# Patient Record
Sex: Female | Born: 1990 | Hispanic: No | Marital: Single | Smoking: Former smoker
Health system: Southern US, Community
[De-identification: ages and names within clinical notes are randomized; demographics above are authoritative.]

## PROBLEM LIST (undated history)

## (undated) ENCOUNTER — Emergency Department (HOSPITAL_COMMUNITY): Discharge status: Absent without leave | Payer: BLUE CROSS/BLUE SHIELD

## (undated) DIAGNOSIS — M791 Myalgia, unspecified site: Secondary | ICD-10-CM

## (undated) DIAGNOSIS — F419 Anxiety disorder, unspecified: Secondary | ICD-10-CM

## (undated) HISTORY — DX: Myalgia, unspecified site: M79.10

## (undated) HISTORY — DX: Anxiety disorder, unspecified: F41.9

---
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
## 2008-01-20 ENCOUNTER — Ambulatory Visit: Payer: Self-pay | Admitting: Family Medicine

## 2008-01-20 DIAGNOSIS — J45909 Unspecified asthma, uncomplicated: Secondary | ICD-10-CM | POA: Insufficient documentation

## 2008-02-17 ENCOUNTER — Ambulatory Visit: Payer: Self-pay | Admitting: Family Medicine

## 2008-03-19 ENCOUNTER — Ambulatory Visit: Payer: Self-pay | Admitting: Family Medicine

## 2013-06-09 ENCOUNTER — Ambulatory Visit (INDEPENDENT_AMBULATORY_CARE_PROVIDER_SITE_OTHER): Payer: BC Managed Care – PPO | Admitting: Family Medicine

## 2013-06-09 ENCOUNTER — Ambulatory Visit (INDEPENDENT_AMBULATORY_CARE_PROVIDER_SITE_OTHER): Payer: BC Managed Care – PPO

## 2013-06-09 ENCOUNTER — Encounter: Payer: Self-pay | Admitting: Family Medicine

## 2013-06-09 VITALS — BP 143/84 | HR 95 | Wt 212.0 lb

## 2013-06-09 DIAGNOSIS — M545 Low back pain, unspecified: Secondary | ICD-10-CM

## 2013-06-09 MED ORDER — PREDNISONE 20 MG PO TABS: ORAL_TABLET | ORAL | Status: AC

## 2013-06-09 NOTE — Progress Notes (Signed)
CC: Jessica Cannon is a 23 y.o. female is here for Back Pain   Subjective: HPI:  Very pleasant 23 year old here to establish care  Patient reports a history of chronic back pain that has been present for matter of 8 years. It comes and goes on a monthly basis. Since Friday she has had any acute worsening of the character and severity of her back pain. Pain is not described as a tightness, soreness, localized in the middle of the back in the lower lumbar region that occasionally radiates into the legs however she is uncertain if one leg is more affected than the other. Symptoms are worse with sitting for long periods of time, back extension. Symptoms are improved with bending forward, anti-inflammatories.  She denies any recent or remote trauma or overexertion. She describes it only as pain. Denies overlying skin changes, subtle paresthesia, bowel or bladder incontinence.    Review of Systems - General ROS: negative for - chills, fever, night sweats, weight gain or weight loss Ophthalmic ROS: negative for - decreased vision Psychological ROS: negative for - anxiety or depression ENT ROS: negative for - hearing change, nasal congestion, tinnitus or allergies Hematological and Lymphatic ROS: negative for - bleeding problems, bruising or swollen lymph nodes Breast ROS: negative Respiratory ROS: no cough, shortness of breath, or wheezing Cardiovascular ROS: no chest pain or dyspnea on exertion Gastrointestinal ROS: no abdominal pain, change in bowel habits, or black or bloody stools Genito-Urinary ROS: negative for - genital discharge, genital ulcers, incontinence or abnormal bleeding from genitals Musculoskeletal ROS: negative for - joint pain or muscle pain other than that described above Neurological ROS: negative for - headaches or memory loss Dermatological ROS: negative for lumps, mole changes, rash and skin lesion changes  Past Medical History  Diagnosis Date  . Anxiety   . Muscle ache      Past Surgical History  Procedure Laterality Date  . Cesarean section     Family History  Problem Relation Age of Onset  . Alcohol abuse Mother   . Hypertension Mother   . Diabetes Brother     History   Social History  . Marital Status: Single    Spouse Name: N/A    Number of Children: N/A  . Years of Education: N/A   Occupational History  . Not on file.   Social History Main Topics  . Smoking status: Current Every Day Smoker -- 0.25 packs/day  . Smokeless tobacco: Never Used  . Alcohol Use: No  . Drug Use: No  . Sexual Activity: Yes    Birth Control/ Protection: Pill     Comment: pt is unsure of the name    Other Topics Concern  . Not on file   Social History Narrative  . No narrative on file     Objective: BP 143/84  Pulse 95  Wt 212 lb (96.163 kg)  General: Alert and Oriented, No Acute Distress HEENT: Pupils equal, round, reactive to light. Conjunctivae clear.   Lungs: Clear to auscultation bilaterally, no wheezing/ronchi/rales.  Comfortable work of breathing. Good air movement. Cardiac: Regular rate and rhythm. Normal S1/S2.  No murmurs, rubs, nor gallops.   Back: Pain is reproduced with palpation of L4 and L3 spinous process with mild reproduction of pain with palpation of paraspinal musculature. Stork test is positive bilaterally. Straight leg raises and Faber negative bilaterally. She has full range of motion strength in both lower extremities with normal L4 and S1 DTRs two over four bilaterally.full range of  motion in flexion and extension of the lumbar spine  Extremities: No peripheral edema.  Strong peripheral pulses.  Mental Status: No depression, anxiety, nor agitation. Skin: Warm and dry.  Assessment & Plan: Jessica Cannon was seen today for back pain.  Diagnoses and associated orders for this visit:  Low back pain - predniSONE (DELTASONE) 20 MG tablet; Three tabs daily days 1-3, two tabs daily days 4-6, one tab daily days 7-9, half tab daily days  10-13. - DG Lumbar Spine Complete; Future    Low back pain: Suspicion of facet disease causing etiology, she's never had an x-ray of her back and we will obtain one today given chronicity of on-and-off pain. Start prednisone taper.  Return if symptoms worsen or fail to improve.

## 2013-06-13 ENCOUNTER — Telehealth: Payer: Self-pay | Admitting: *Deleted

## 2013-06-13 NOTE — Telephone Encounter (Signed)
Pt called and left a vague message that she had questions about her xrays. I called and left a message that she could call me back and leave a detailed brief message about with her questions so that I can call back with an answer or if she needed to speak with a triage to nurse to dial that number and listen to the prompt for that number

## 2013-09-26 ENCOUNTER — Encounter: Payer: Self-pay | Admitting: Family Medicine

## 2013-09-26 DIAGNOSIS — N912 Amenorrhea, unspecified: Secondary | ICD-10-CM | POA: Insufficient documentation

## 2013-10-04 ENCOUNTER — Ambulatory Visit (INDEPENDENT_AMBULATORY_CARE_PROVIDER_SITE_OTHER): Payer: BC Managed Care – PPO | Admitting: Family Medicine

## 2013-10-04 ENCOUNTER — Encounter: Payer: Self-pay | Admitting: Family Medicine

## 2013-10-04 VITALS — BP 122/89 | HR 83 | Wt 213.0 lb

## 2013-10-04 DIAGNOSIS — G5600 Carpal tunnel syndrome, unspecified upper limb: Secondary | ICD-10-CM

## 2013-10-04 DIAGNOSIS — G5602 Carpal tunnel syndrome, left upper limb: Secondary | ICD-10-CM

## 2013-10-04 DIAGNOSIS — Z23 Encounter for immunization: Secondary | ICD-10-CM

## 2013-10-04 NOTE — Progress Notes (Signed)
CC: Jessica Cannon is a 23 y.o. female is here for left hand/wrist pain   Subjective: HPI:  Complete left hand discomfort described as numbness and tingling localized in the palm of the hand mostly involving the index, middle, thumb. It has been present for the past year but most problematic over the past 2 months. Slightly improved with wearing a wrist immobilizer. Symptoms are worse when typing and after falling asleep. Other than above nothing seems to make better or worse. She reports weakness only when symptoms are present. Denies any overlying skin changes, swelling, redness, warmth. Denies any recent or remote trauma denies any joint pain.   Review Of Systems Outlined In HPI  Past Medical History  Diagnosis Date  . Anxiety   . Muscle ache     Past Surgical History  Procedure Laterality Date  . Cesarean section     Family History  Problem Relation Age of Onset  . Alcohol abuse Mother   . Hypertension Mother   . Diabetes Brother     History   Social History  . Marital Status: Single    Spouse Name: N/A    Number of Children: N/A  . Years of Education: N/A   Occupational History  . Not on file.   Social History Main Topics  . Smoking status: Current Every Day Smoker -- 0.25 packs/day  . Smokeless tobacco: Never Used  . Alcohol Use: No  . Drug Use: No  . Sexual Activity: Yes    Birth Control/ Protection: Pill     Comment: pt is unsure of the name    Other Topics Concern  . Not on file   Social History Narrative  . No narrative on file     Objective: BP 122/89  Pulse 83  Wt 213 lb (96.616 kg)  General: Alert and Oriented, No Acute Distress HEENT: Pupils equal, round, reactive to light. Conjunctivae clear.  Moist mucous membranes pharynx unremarkable Extremities: No peripheral edema.  Strong peripheral pulses. Full range of motion and strength in the left wrist and distally throughout the hand. Finkelstein's negative, no pain in anatomical snuff box. Pain  reproduced with reverse Phalen's. No sign of atrophy in the hands were overlying skin changes in the region of her discomfort Mental Status: No depression, anxiety, nor agitation. Skin: Warm and dry.  Assessment & Plan: Jessica Cannon was seen today for left hand/wrist pain.  Diagnoses and associated orders for this visit:  Left carpal tunnel syndrome    Continue to wear the brace while sleeping and during any waking hours other than when taking a shower or using the toilet. She was given a handout on home rehabilitative exercises to be done on a daily basis the next 4 weeks and if no better at that point referral to Dr. Karie Schwalbe. in sports medicine for consideration of steroid injection  Return if symptoms worsen or fail to improve.

## 2013-11-16 ENCOUNTER — Ambulatory Visit (INDEPENDENT_AMBULATORY_CARE_PROVIDER_SITE_OTHER): Payer: BC Managed Care – PPO | Admitting: Sports Medicine

## 2013-11-16 ENCOUNTER — Encounter: Payer: Self-pay | Admitting: Sports Medicine

## 2013-11-16 VITALS — BP 137/91 | HR 92 | Ht 66.0 in | Wt 215.0 lb

## 2013-11-16 DIAGNOSIS — N3090 Cystitis, unspecified without hematuria: Secondary | ICD-10-CM | POA: Insufficient documentation

## 2013-11-16 DIAGNOSIS — R300 Dysuria: Secondary | ICD-10-CM

## 2013-11-16 LAB — POCT URINALYSIS DIPSTICK
Bilirubin, UA: NEGATIVE
Blood, UA: NEGATIVE
Glucose, UA: NEGATIVE
Ketones, UA: NEGATIVE
Nitrite, UA: NEGATIVE
Protein, UA: NEGATIVE
Spec Grav, UA: 1.02
Urobilinogen, UA: 0.2
pH, UA: 6

## 2013-11-16 MED ORDER — CEPHALEXIN 500 MG PO CAPS: 500.0000 mg | ORAL_CAPSULE | Freq: Two times a day (BID) | ORAL | Status: DC

## 2013-11-16 NOTE — Progress Notes (Signed)
  Subjective:    CC: dysuria  HPI: This is a pleasant 23 year old female, she comes in with a several-day history of burning, urgency, pain with urination. No flank pain, no abdominal pain, no constitutional symptoms. she did have a bit of nausea.  Past medical history, Surgical history, Family history not pertinant except as noted below, Social history, Allergies, and medications have been entered into the medical record, reviewed, and no changes needed.   Review of Systems: No fevers, chills, night sweats, weight loss, chest pain, or shortness of breath.   Objective:    General: Well Developed, well nourished, and in no acute distress.  Neuro: Alert and oriented x3, extra-ocular muscles intact, sensation grossly intact.  HEENT: Normocephalic, atraumatic, pupils equal round reactive to light, neck supple, no masses, no lymphadenopathy, thyroid nonpalpable.  Skin: Warm and dry, no rashes. Cardiac: Regular rate and rhythm, no murmurs rubs or gallops, no lower extremity edema.  Respiratory: Clear to auscultation bilaterally. Not using accessory muscles, speaking in full sentences. Abdomen: Soft, only minimally tender to palpation in the epigastrium, nondistended, normal bowel sounds, no costovertebral angle pain, no guarding, no rigidity.  Urinalysis shows pyuria.  Impression and Recommendations:

## 2013-11-16 NOTE — Assessment & Plan Note (Signed)
Urine culture, Keflex for 7 days. She did have some epigastric pain, she will return if this hasn't resolved in a week.

## 2013-11-19 LAB — URINE CULTURE
Colony Count: NO GROWTH
Organism ID, Bacteria: NO GROWTH

## 2014-01-08 ENCOUNTER — Telehealth: Payer: Self-pay

## 2014-01-08 MED ORDER — PREDNISONE 20 MG PO TABS: ORAL_TABLET | ORAL | Status: AC

## 2014-01-08 NOTE — Telephone Encounter (Signed)
I'd still recommend PT since it will prevent future pain however I've also sent a prednisone taper to [facility] here in [place].

## 2014-01-08 NOTE — Telephone Encounter (Signed)
Jessica Cannon called and reports a flare up of her low back pain for a few days. She states Dr Ivan Anchors told her to call in for a refill on the Prednisone taper pack when needed. I did mention he Dr Hommel's recommendation of PT on the imaging result. She was agreeable to the PT but would like to call them first to find out what her co-pay would be. Provided patient with the phone number to our PT. Also mentioned that she may need to schedule a follow up visit with Dr Ivan Anchors.

## 2014-01-09 NOTE — Telephone Encounter (Signed)
Message left on vm 

## 2014-04-27 ENCOUNTER — Ambulatory Visit: Payer: Self-pay | Admitting: Physician Assistant

## 2014-04-30 ENCOUNTER — Ambulatory Visit (INDEPENDENT_AMBULATORY_CARE_PROVIDER_SITE_OTHER): Payer: BLUE CROSS/BLUE SHIELD | Admitting: Physician Assistant

## 2014-04-30 ENCOUNTER — Encounter: Payer: Self-pay | Admitting: Physician Assistant

## 2014-04-30 VITALS — BP 151/93 | HR 96 | Ht 66.0 in | Wt 227.0 lb

## 2014-04-30 DIAGNOSIS — F410 Panic disorder [episodic paroxysmal anxiety] without agoraphobia: Secondary | ICD-10-CM | POA: Diagnosis not present

## 2014-04-30 DIAGNOSIS — F430 Acute stress reaction: Secondary | ICD-10-CM

## 2014-04-30 DIAGNOSIS — F411 Generalized anxiety disorder: Secondary | ICD-10-CM

## 2014-04-30 MED ORDER — ALPRAZOLAM 0.5 MG PO TABS: 0.5000 mg | ORAL_TABLET | Freq: Two times a day (BID) | ORAL | Status: DC | PRN

## 2014-04-30 MED ORDER — SERTRALINE HCL 50 MG PO TABS: ORAL_TABLET | ORAL | Status: DC

## 2014-04-30 NOTE — Progress Notes (Signed)
   Subjective:    Patient ID: Jessica Cannon, female    DOB: 08/06/1990, [age] y.o.   MRN: [id number]  HPI  Pt has had a hx of anxiety for about 9 years since her daughter was born. Never too bad as long as on xanax prn and zoloft. She lost insurance and not been on in years. For the last year been getting worse and worse. She has problems decision making, she constantly feels anxious. For last couple of months anxiety builds to a panic attack where her heart flutters and face gets red. Tuesday a co-worker yelled at her and she lost it. Cried, chest tightness, SOB, shaking. No suicidal or homicidal htoughts.    Review of Systems  All other systems reviewed and are negative.      Objective:   Physical Exam  Constitutional: She is oriented to person, place, and time. She appears well-developed and well-nourished.  HENT:  Head: Normocephalic and atraumatic.  Cardiovascular: Normal rate, regular rhythm and normal heart sounds.   Pulmonary/Chest: Effort normal and breath sounds normal.  Neurological: She is alert and oriented to person, place, and time.  Psychiatric:  Tearful and jumpy.           Assessment & Plan:  GAD/acute stress/panic attacks/Depression- PHQ-9 was 10. GAD-7 was 16. Restarted zoloft to 50mg  qd and xanax as needed. Discussed abuse potential and to only use for panic attacks. Follow up in 4-6 weeks with PCP.   Elevated BP today- pt having a lot of anxiety. Will follow up with PCP to make sure comes down.

## 2014-06-05 ENCOUNTER — Encounter: Payer: Self-pay | Admitting: Family Medicine

## 2014-06-05 ENCOUNTER — Ambulatory Visit (INDEPENDENT_AMBULATORY_CARE_PROVIDER_SITE_OTHER): Payer: BLUE CROSS/BLUE SHIELD | Admitting: Family Medicine

## 2014-06-05 VITALS — BP 124/82 | HR 96 | Wt 222.0 lb

## 2014-06-05 DIAGNOSIS — F411 Generalized anxiety disorder: Secondary | ICD-10-CM | POA: Diagnosis not present

## 2014-06-05 MED ORDER — ALPRAZOLAM 0.5 MG PO TABS: 0.5000 mg | ORAL_TABLET | Freq: Two times a day (BID) | ORAL | Status: DC | PRN

## 2014-06-05 MED ORDER — SERTRALINE HCL 50 MG PO TABS: ORAL_TABLET | ORAL | Status: DC

## 2014-06-05 NOTE — Progress Notes (Signed)
CC: Jessica Cannon is a 24 y.o. female is here for f/u depression   Subjective: HPI:  Follow-up anxiety: She was started on Zoloft late last month and is currently taking 50 mg daily. She feels that after 2 weeks of taking this medication daily she had a significant improvement in reduction of social anxiety and panic attacks. She still gets nervous when going to crowded restaurants but Xanax will eliminate any physical symptoms such as hyperventilation, tremor, and a sense of fear. She tells me she's only had to take this medication 5 times and she usually took half a dose. Denies any counseling or harm herself or others. Denies emotional blunting or any other side effects from the medication.   Review Of Systems Outlined In HPI  Past Medical History  Diagnosis Date  . Anxiety   . Muscle ache     Past Surgical History  Procedure Laterality Date  . Cesarean section     Family History  Problem Relation Age of Onset  . Alcohol abuse Mother   . Hypertension Mother   . Diabetes Brother     History   Social History  . Marital Status: Single    Spouse Name: N/A  . Number of Children: N/A  . Years of Education: N/A   Occupational History  . Not on file.   Social History Main Topics  . Smoking status: Current Every Day Smoker -- 0.25 packs/day  . Smokeless tobacco: Never Used  . Alcohol Use: No  . Drug Use: No  . Sexual Activity: Yes    Birth Control/ Protection: Pill     Comment: pt is unsure of the name    Other Topics Concern  . Not on file   Social History Narrative     Objective: BP 124/82 mmHg  Pulse 96  Wt 222 lb (100.699 kg)  Vital signs reviewed. General: Alert and Oriented, No Acute Distress HEENT: Pupils equal, round, reactive to light. Conjunctivae clear.  External ears unremarkable.  Moist mucous membranes. Lungs: Clear and comfortable work of breathing, speaking in full sentences without accessory muscle use. Cardiac: Regular rate and rhythm.  Neuro:  CN II-XII grossly intact, gait normal. Extremities: No peripheral edema.  Strong peripheral pulses.  Mental Status: No depression, anxiety, nor agitation. Logical though process. Skin: Warm and dry. Assessment & Plan: Jessica Cannon was seen today for f/u depression.  Diagnoses and all orders for this visit:  GAD (generalized anxiety disorder) Orders: -     ALPRAZolam (XANAX) 0.5 MG tablet; Take 1 tablet (0.5 mg total) by mouth 2 (two) times daily as needed for anxiety. -     Discontinue: sertraline (ZOLOFT) 50 MG tablet; Take 1/2 tablet for 7 days then once daily. -     sertraline (ZOLOFT) 50 MG tablet; One tab by mouth daily.   anxiety: Controlled continue daily Zoloft discussed if there is room for improvement we can always increase this medication. May continue to use as needed Xanax.   Return in about 3 months (around 09/05/2014).

## 2014-10-01 ENCOUNTER — Other Ambulatory Visit: Payer: Self-pay | Admitting: Family Medicine

## 2015-03-09 ENCOUNTER — Other Ambulatory Visit: Payer: Self-pay | Admitting: Family Medicine

## 2015-03-11 ENCOUNTER — Telehealth: Payer: Self-pay | Admitting: Family Medicine

## 2015-03-11 LAB — HM PAP SMEAR: HM Pap smear: NEGATIVE

## 2015-03-11 LAB — HIV AG/AB COMBO WITH REFLEX: HIV Screen 4th Generation wRfx: NEGATIVE

## 2015-03-11 NOTE — Telephone Encounter (Signed)
I called pt and scheduled her F/u and med refills for 03-19-15 at 0915 with Dr.Hommel

## 2015-03-19 ENCOUNTER — Ambulatory Visit: Payer: BLUE CROSS/BLUE SHIELD | Admitting: Family Medicine

## 2015-03-21 ENCOUNTER — Ambulatory Visit (INDEPENDENT_AMBULATORY_CARE_PROVIDER_SITE_OTHER): Payer: BLUE CROSS/BLUE SHIELD | Admitting: Family Medicine

## 2015-03-21 ENCOUNTER — Encounter: Payer: Self-pay | Admitting: Family Medicine

## 2015-03-21 VITALS — BP 132/87 | HR 74 | Wt 226.0 lb

## 2015-03-21 DIAGNOSIS — F410 Panic disorder [episodic paroxysmal anxiety] without agoraphobia: Secondary | ICD-10-CM

## 2015-03-21 DIAGNOSIS — F411 Generalized anxiety disorder: Secondary | ICD-10-CM

## 2015-03-21 MED ORDER — SERTRALINE HCL 100 MG PO TABS: 100.0000 mg | ORAL_TABLET | Freq: Every day | ORAL | Status: DC

## 2015-03-21 MED ORDER — ALPRAZOLAM 0.5 MG PO TABS: 0.5000 mg | ORAL_TABLET | Freq: Two times a day (BID) | ORAL | Status: DC | PRN

## 2015-03-21 NOTE — Progress Notes (Signed)
CC: Jessica OysterDiana Cannon is a 25 y.o. female is here for Depression and Anxiety   Subjective: HPI:  Follow-up generalized anxiety disorder: She is taking Zoloft on a daily basis and believes this helping to at least a mild degree. She's been avoiding restaurants and other shopping centers that are new to her due to fear it may cause a panic attack. She's also noticed that she standing alignment is pressured to make a decision for purchasing something that'll cause her to have a rapid heartbeat and an impending sense of doom. Symptoms are bad enough to where it's limiting where she can go out her boyfriend. She also noticed that she gets much more anxious [place] and has begun avoiding local highways. She denies any depression or any other mental disturbance. She's had a handful panic attack since I saw her last, she is happy to report that she go matter weeks before needing to take a Xanax for panic attack. She denies any chest pain, fever, chills, unintentional weight loss or appetite changes.   Review Of Systems Outlined In HPI  Past Medical History  Diagnosis Date  . Anxiety   . Muscle ache     Past Surgical History  Procedure Laterality Date  . Cesarean section     Family History  Problem Relation Age of Onset  . Alcohol abuse Mother   . Hypertension Mother   . Diabetes Brother     Social History   Social History  . Marital Status: Single    Spouse Name: N/A  . Number of Children: N/A  . Years of Education: N/A   Occupational History  . Not on file.   Social History Main Topics  . Smoking status: Current Every Day Smoker -- 0.25 packs/day  . Smokeless tobacco: Never Used  . Alcohol Use: No  . Drug Use: No  . Sexual Activity: Yes    Birth Control/ Protection: Pill     Comment: pt is unsure of the name    Other Topics Concern  . Not on file   Social History Narrative     Objective: BP 132/87 mmHg  Pulse 74  Wt 226 lb (102.513 kg)  Vital signs  reviewed. General: Alert and Oriented, No Acute Distress HEENT: Pupils equal, round, reactive to light. Conjunctivae clear.  External ears unremarkable.  Moist mucous membranes. Lungs: Clear and comfortable work of breathing, speaking in full sentences without accessory muscle use. Cardiac: Regular rate and rhythm.  Neuro: CN II-XII grossly intact, gait normal. Extremities: No peripheral edema.  Strong peripheral pulses.  Mental Status: No depression, anxiety, nor agitation. Logical though process. Skin: Warm and dry.  Assessment & Plan: Jessica Cannon was seen today for depression and anxiety.  Diagnoses and all orders for this visit:  GAD (generalized anxiety disorder) -     sertraline (ZOLOFT) 100 MG tablet; Take 1 tablet (100 mg total) by mouth daily.  Panic attack -     sertraline (ZOLOFT) 100 MG tablet; Take 1 tablet (100 mg total) by mouth daily.  Other orders -     ALPRAZolam (XANAX) 0.5 MG tablet; Take 1 tablet (0.5 mg total) by mouth 2 (two) times daily as needed for anxiety.   GAD: Uncontrolled chronic condition increasing Zoloft. Panic attacks: Controlled with sparing use of alprazolam, is likely the frequency of these will be reduced with the increase of Zoloft.  Return in about 3 months (around 06/21/2015).

## 2015-06-05 IMAGING — CR DG LUMBAR SPINE COMPLETE 4+V
5 series · 5 of 5 positions shown · non-contrast
Comparison: None.

CLINICAL DATA: Low back pain

EXAM:
LUMBAR SPINE - COMPLETE 4+ VIEW

[view not recorded (1 of 5)]
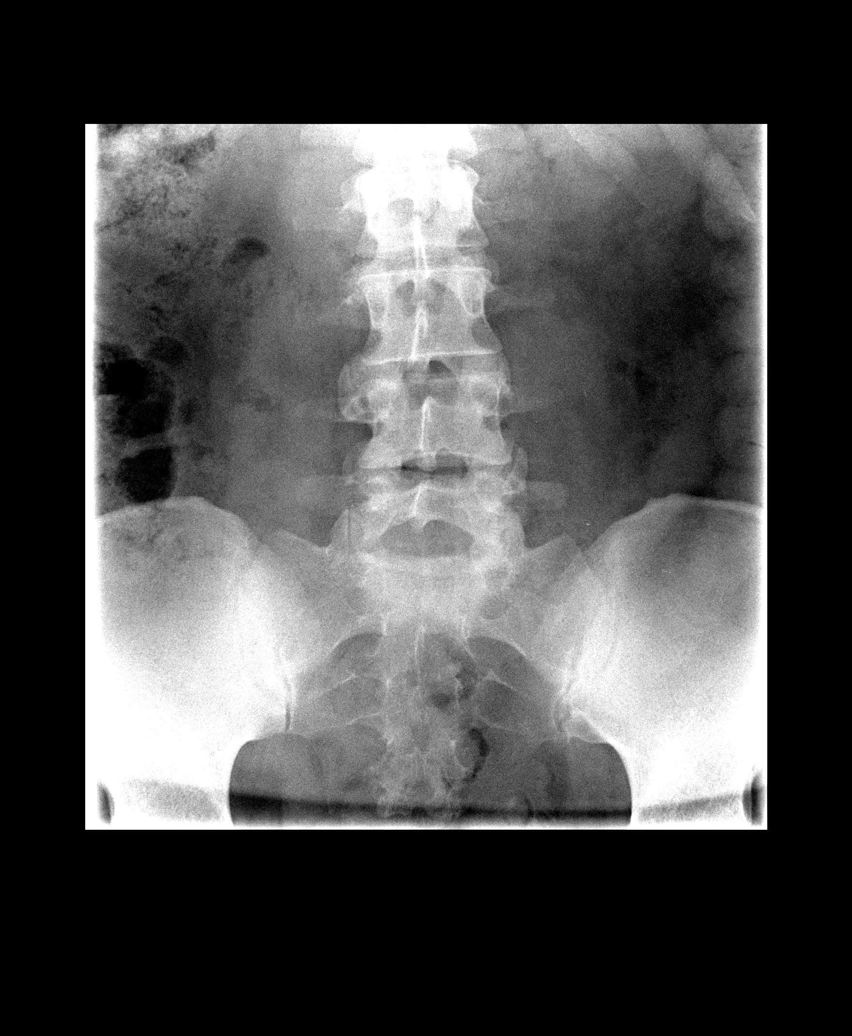

[view not recorded (2 of 5)]
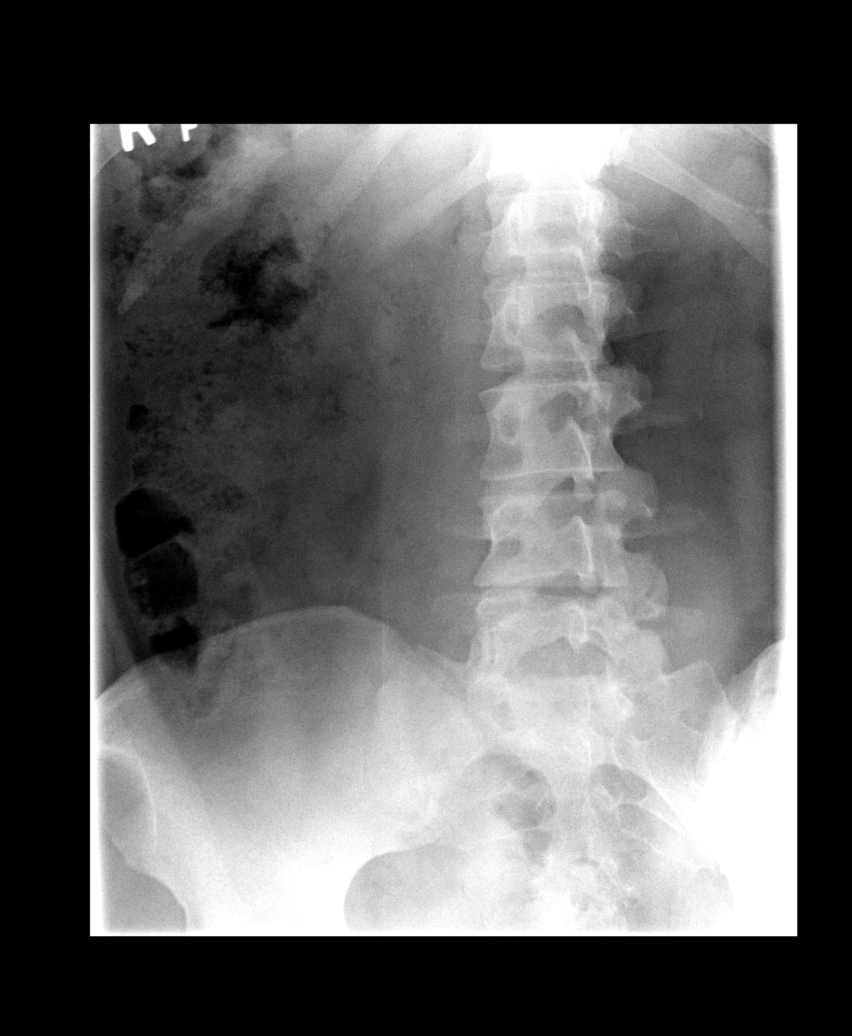

[view not recorded (3 of 5)]
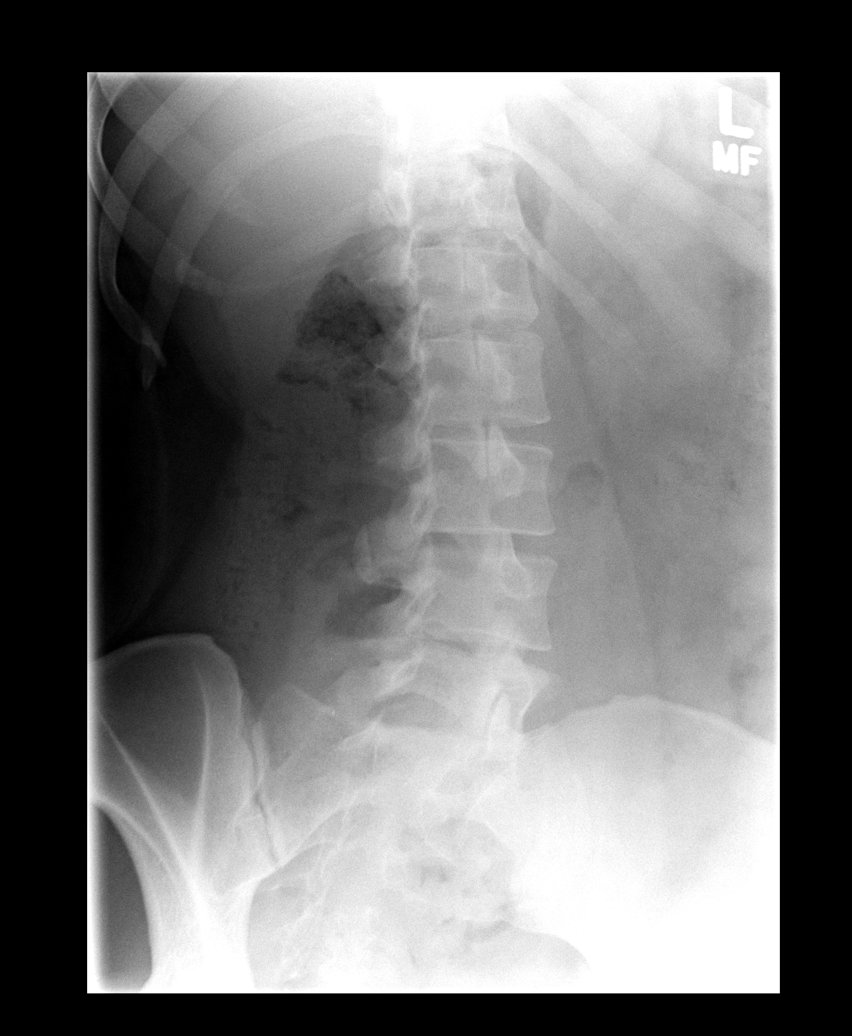

[view not recorded (4 of 5)]
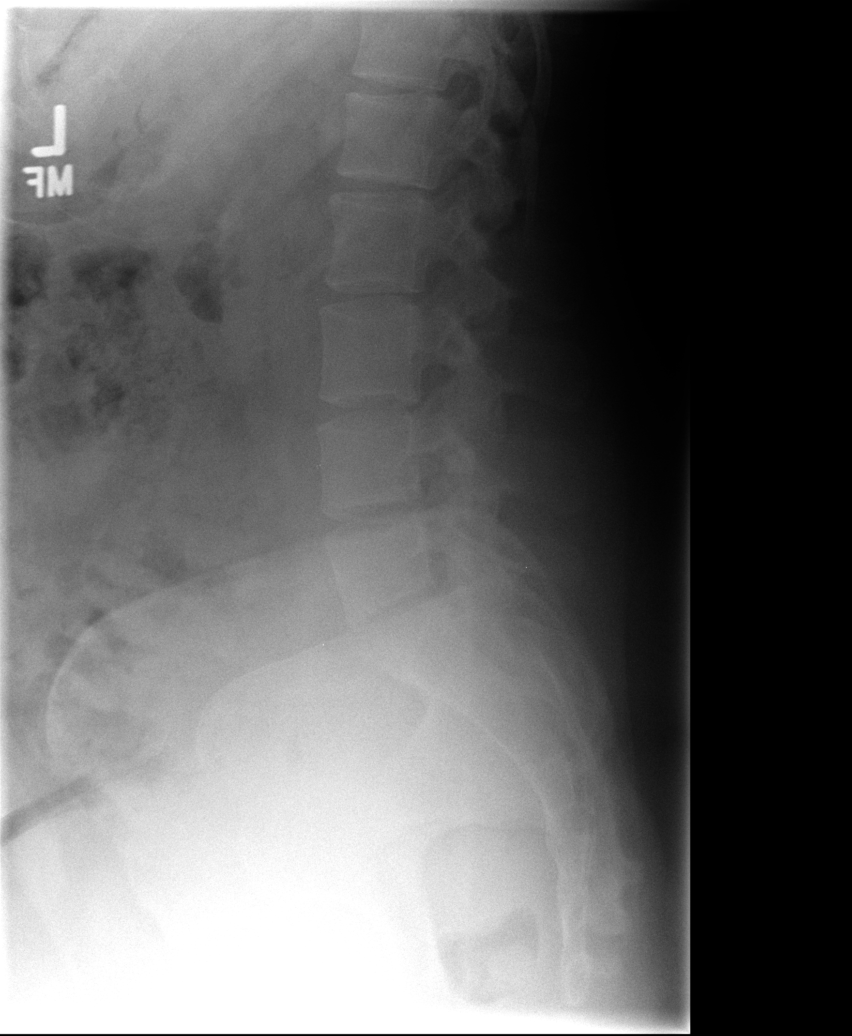

[view not recorded (5 of 5)]
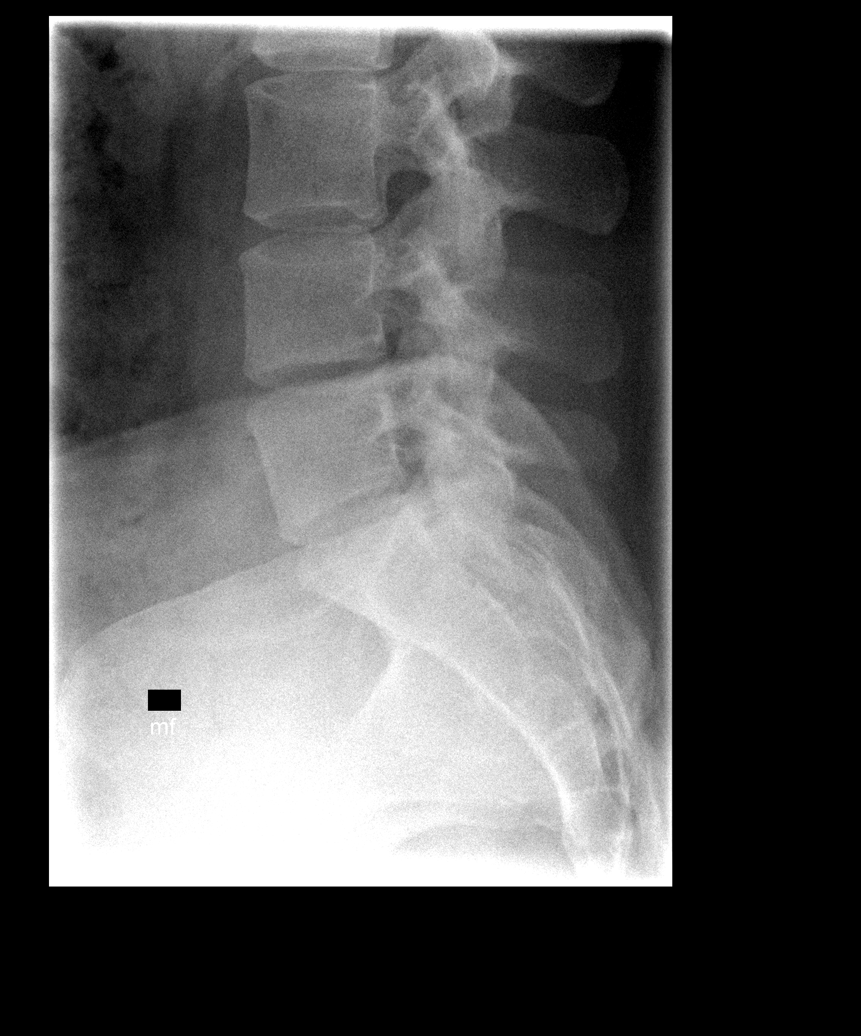

[5 of 5 positions shown; findings below may reference images not displayed]

FINDINGS: There is no evidence of lumbar spine fracture. Alignment is normal.
Intervertebral disc spaces are maintained.
IMPRESSION: Negative.

## 2015-07-25 ENCOUNTER — Encounter: Payer: Self-pay | Admitting: Family Medicine

## 2015-07-25 ENCOUNTER — Ambulatory Visit (INDEPENDENT_AMBULATORY_CARE_PROVIDER_SITE_OTHER): Payer: 59 | Admitting: Family Medicine

## 2015-07-25 VITALS — BP 134/90 | HR 83 | Wt 243.0 lb

## 2015-07-25 DIAGNOSIS — F410 Panic disorder [episodic paroxysmal anxiety] without agoraphobia: Secondary | ICD-10-CM | POA: Diagnosis not present

## 2015-07-25 DIAGNOSIS — I10 Essential (primary) hypertension: Secondary | ICD-10-CM | POA: Diagnosis not present

## 2015-07-25 DIAGNOSIS — F411 Generalized anxiety disorder: Secondary | ICD-10-CM | POA: Diagnosis not present

## 2015-07-25 MED ORDER — SERTRALINE HCL 100 MG PO TABS: 150.0000 mg | ORAL_TABLET | Freq: Every day | ORAL | Status: DC

## 2015-07-25 NOTE — Progress Notes (Signed)
CC: Jessica OysterDiana Cannon is a 25 y.o. female is here for Hospitalization Follow-up and Hypertension   Subjective: HPI:  [facility] and noticed that it's always around 150/100. No other outside blood pressures report. She denies chest pain shortness of breath orthopnea peripheral edema but has had a headache. She further describes his headache as a right-sided headache which is dull, worse with bright light and movement. She is not sure what causes this. It happens a couple times a week. It is moderate in severity and goes away within a few hours. No interventions as of yet. She's noticed that her blood pressure is worse during stressful periods of her day. She feels like her anxiety is getting a little bit worse. Denies paranoia or depression   Review Of Systems Outlined In HPI  Past Medical History  Diagnosis Date  . Anxiety   . Muscle ache     Past Surgical History  Procedure Laterality Date  . Cesarean section     Family History  Problem Relation Age of Onset  . Alcohol abuse Mother   . Hypertension Mother   . Diabetes Brother     Social History   Social History  . Marital Status: Single    Spouse Name: N/A  . Number of Children: N/A  . Years of Education: N/A   Occupational History  . Not on file.   Social History Main Topics  . Smoking status: Current Every Day Smoker -- 0.25 packs/day  . Smokeless tobacco: Never Used  . Alcohol Use: No  . Drug Use: No  . Sexual Activity: Yes    Birth Control/ Protection: Pill     Comment: pt is unsure of the name    Other Topics Concern  . Not on file   Social History Narrative     Objective: BP 134/90 mmHg  Pulse 83  Wt 243 lb (110.224 kg)  General: Alert and Oriented, No Acute Distress HEENT: Pupils equal, round, reactive to light. Conjunctivae clear.  Moist mucous membranes Lungs: Clear to auscultation bilaterally, no wheezing/ronchi/rales.  Comfortable work of  breathing. Good air movement. Cardiac: Regular rate and rhythm. Normal S1/S2.  No murmurs, rubs, nor gallops.   Extremities: No peripheral edema.  Strong peripheral pulses.  Mental Status: No depression, anxiety, nor agitation. Skin: Warm and dry.  Assessment & Plan: Jessica MossesDiana was seen today for hospitalization follow-up and hypertension.  Diagnoses and all orders for this visit:  GAD (generalized anxiety disorder) -     sertraline (ZOLOFT) 100 MG tablet; Take 1.5 tablets (150 mg total) by mouth daily.  Panic attack -     sertraline (ZOLOFT) 100 MG tablet; Take 1.5 tablets (150 mg total) by mouth daily.  Essential hypertension   Generalized anxiety disorder: Uncontrolled, increasing Zoloft Essential hypertension: There's a big room for improvement with respect reducing her sodium intake. We discussed ways of achieving this and how to use a nutrition label to focus on sodium intake of less than 2000 mg a day. I've encouraged her to try to take a magnesium supplement to help reduce her headaches.Signs and symptoms requring emergent/urgent reevaluation were discussed with the patient.  Return in about 3 weeks (around 08/15/2015).

## 2015-08-15 ENCOUNTER — Ambulatory Visit: Payer: 59 | Admitting: Family Medicine

## 2015-10-28 ENCOUNTER — Encounter: Payer: Self-pay | Admitting: Osteopathic Medicine

## 2015-10-28 ENCOUNTER — Ambulatory Visit (INDEPENDENT_AMBULATORY_CARE_PROVIDER_SITE_OTHER): Payer: 59 | Admitting: Osteopathic Medicine

## 2015-10-28 VITALS — BP 125/76 | HR 79 | Ht 67.0 in | Wt 251.0 lb

## 2015-10-28 DIAGNOSIS — F411 Generalized anxiety disorder: Secondary | ICD-10-CM

## 2015-10-28 DIAGNOSIS — F410 Panic disorder [episodic paroxysmal anxiety] without agoraphobia: Secondary | ICD-10-CM

## 2015-10-28 DIAGNOSIS — Z111 Encounter for screening for respiratory tuberculosis: Secondary | ICD-10-CM

## 2015-10-28 DIAGNOSIS — Z23 Encounter for immunization: Secondary | ICD-10-CM

## 2015-10-28 DIAGNOSIS — E282 Polycystic ovarian syndrome: Secondary | ICD-10-CM

## 2015-10-28 DIAGNOSIS — Z0000 Encounter for general adult medical examination without abnormal findings: Secondary | ICD-10-CM | POA: Diagnosis not present

## 2015-10-28 LAB — CBC WITH DIFFERENTIAL/PLATELET
BASOS ABS: 0 {cells}/uL (ref 0–200)
BASOS PCT: 0 %
EOS ABS: 440 {cells}/uL (ref 15–500)
Eosinophils Relative: 4 %
HCT: 39.5 % (ref 35.0–45.0)
HEMOGLOBIN: 12.9 g/dL (ref 11.7–15.5)
LYMPHS ABS: 3410 {cells}/uL (ref 850–3900)
Lymphocytes Relative: 31 %
MCH: 26.9 pg — AB (ref 27.0–33.0)
MCHC: 32.7 g/dL (ref 32.0–36.0)
MCV: 82.3 fL (ref 80.0–100.0)
MONO ABS: 990 {cells}/uL — AB (ref 200–950)
MONOS PCT: 9 %
MPV: 10.1 fL (ref 7.5–12.5)
NEUTROS ABS: 6160 {cells}/uL (ref 1500–7800)
Neutrophils Relative %: 56 %
PLATELETS: 348 10*3/uL (ref 140–400)
RBC: 4.8 MIL/uL (ref 3.80–5.10)
RDW: 14.9 % (ref 11.0–15.0)
WBC: 11 10*3/uL — ABNORMAL HIGH (ref 3.8–10.8)

## 2015-10-28 LAB — TSH: TSH: 2.5 mIU/L

## 2015-10-28 MED ORDER — SERTRALINE HCL 100 MG PO TABS: 150.0000 mg | ORAL_TABLET | Freq: Every day | ORAL | 3 refills | Status: DC

## 2015-10-28 MED ORDER — ALPRAZOLAM 0.5 MG PO TABS: ORAL_TABLET | ORAL | 0 refills | Status: DC

## 2015-10-28 MED ORDER — METFORMIN HCL ER 500 MG PO TB24: 750.0000 mg | ORAL_TABLET | Freq: Two times a day (BID) | ORAL | 3 refills | Status: DC

## 2015-10-28 NOTE — Progress Notes (Signed)
HPI: Jessica Cannon is a 25 y.o. female  who presents to [facility] today, 10/28/15,  for chief complaint of:  Chief Complaint  Patient presents with  . Annual Exam    Patient here to establish care, annual physical exam requested. Needs paperwork filled out for new job. Needs PPD test. Requests flu shot.  See below for review of preventive care.  Generalized anxiety disorder: Patient has been on Zoloft 150 mg daily and doing fairly well on this medication with sparing use of Xanax, last refilled 30 tablets in March 2017. We'll be losing insurance due to upcoming job switch, requests refill of Zoloft and Xanax at this time.  PCOS: Patient has been off of metformin, since increasing dose has noticed increased loose stools so stopped the medicine.    Past medical, surgical, social and family history reviewed: Past Medical History:  Diagnosis Date  . Anxiety   . Muscle ache    Past Surgical History:  Procedure Laterality Date  . CESAREAN SECTION     Social History  Substance Use Topics  . Smoking status: Current Every Day Smoker    Packs/day: 0.25  . Smokeless tobacco: Never Used  . Alcohol use No   Family History  Problem Relation Age of Onset  . Alcohol abuse Mother   . Hypertension Mother   . Diabetes Brother      Current medication list and allergy/intolerance information reviewed:   Current Outpatient Prescriptions  Medication Sig Dispense Refill  . ALPRAZolam (XANAX) 0.5 MG tablet Take 1 tablet (0.5 mg total) by mouth 2 (two) times daily as needed for anxiety. 30 tablet 0  . metFORMIN (GLUCOPHAGE-XR) 750 MG 24 hr tablet Take 750 mg by mouth 2 (two) times daily.    . sertraline (ZOLOFT) 100 MG tablet Take 1.5 tablets (150 mg total) by mouth daily. 135 tablet 0   No current facility-administered medications for this visit.    No Known Allergies    Review of Systems:  Constitutional:  No  fever, no chills, No recent illness,  No unintentional weight changes. No significant fatigue.   HEENT: No  headache, no vision change, no hearing change, No sore throat, +sinus pressure  Cardiac: No  chest pain, No  pressure, No palpitations, No  Orthopnea  Respiratory:  No  shortness of breath. No  Cough  Gastrointestinal: No  abdominal pain, No  nausea, No  vomiting,  No  blood in stool, No  diarrhea, No  constipation   Musculoskeletal: No new myalgia/arthralgia  Skin: No  Rash,   Neurologic: No  weakness, No  dizziness  Psychiatric: No  concerns with depression, +concerns with anxiety, No sleep problems, No mood problems  Exam:  BP 125/76   Pulse 79   Ht 5\' 7"  (1.702 m)   Wt 251 lb (113.9 kg)   BMI 39.31 kg/m   Constitutional: VS see above. General Appearance: alert, well-developed, well-nourished, NAD  Eyes: Normal lids and conjunctive, non-icteric sclera  Ears, Nose, Mouth, Throat: MMM, Normal external inspection ears/nares/mouth/lips/gums. TM normal bilaterally. Pharynx/tonsils no erythema, no exudate. Nasal mucosa normal.   Neck: No masses, trachea midline. No thyroid enlargement. No tenderness/mass appreciated. No lymphadenopathy  Respiratory: Normal respiratory effort. no wheeze, no rhonchi, no rales  Cardiovascular: S1/S2 normal, no murmur, no rub/gallop auscultated. RRR. No lower extremity edema.   Gastrointestinal: Nontender, no masses. No hepatomegaly, no splenomegaly. No hernia appreciated. Bowel sounds normal. Rectal exam deferred.   Musculoskeletal: Gait normal. No clubbing/cyanosis  of digits.   Neurological: Normal balance/coordination. No tremor.   Skin: warm, dry, intact. No rash/ulcer.   Psychiatric: Normal judgment/insight. Normal mood and affect. Oriented x3.     ASSESSMENT/PLAN: Labs today for medication monitoring. Patient advised do not take alprazolam if/when we'll be working around children, or if possibility she may be pregnant. Patient declines prescription contraception at  this time.  Annual physical exam - Plan: Flu Vaccine QUAD 36+ mos IM, TB Skin Test, CBC with Differential/Platelet, COMPLETE METABOLIC PANEL WITH GFR, Hemoglobin A1c, TSH, Lipid panel  GAD (generalized anxiety disorder) - Plan: sertraline (ZOLOFT) 100 MG tablet, ALPRAZolam (XANAX) 0.5 MG tablet  Panic attack - Plan: sertraline (ZOLOFT) 100 MG tablet, ALPRAZolam (XANAX) 0.5 MG tablet  PCOS (polycystic ovarian syndrome) - Plan: metFORMIN (GLUCOPHAGE-XR) 500 MG 24 hr tablet, Hemoglobin A1c, Lipid panel    FEMALE PREVENTIVE CARE  ANNUAL SCREENING/COUNSELING  Diet/Exercise - HEALTHY HABITS DISCUSSED TO DECREASE CV RISK - going to the gym   Tobacco - Quit smoking!   Alcohol - social   Depression - PQH2 Negative  Domestic violence concerns - no  HTN SCREENING - SEE VITALS  Vaccination status - SEE BELOW  SEXUAL HEALTH  Sexually active in the past year - Yes with female.  STI testing today? - no  Concerns about libido or pain with sex? - no  Plans for pregnancy? - "if it happens it happens"   INFECTIOUS DISEASE SCREENING  HIV - all 15-65 - does not need  GC/CT - does not need  HepC - DOB [birth year] - does not need  TB - needs  DISEASE SCREENING  Lipid - needs  DM2 - needs  Osteoporosis - does not need  CANCER SCREENING  Cervical - does not need  Breast - does not need  Lung - does not need  Colon - does not need  ADULT VACCINATION  Influenza - was given  Td - was given instructions to come back, we are out  HPV - was not indicated  Zoster - was not indicated  PCV13 - was not indicated  PPSV23 - was not indicated  OTHER  Fall - exercise and Vit D age 57+ - does not need  Consider ASA - age 27-59 - does not need  Visit summary with medication list and pertinent instructions was printed for patient to review. All questions at time of visit were answered - patient instructed to contact office with any additional concerns. ER/RTC precautions were  reviewed with the patient. Follow-up plan: Return in about 1 year (around 10/27/2016[facility] or sooner if needed for anxiety/PCOS or other medical problems.

## 2015-10-29 LAB — HEMOGLOBIN A1C
Hgb A1c MFr Bld: 5.6 % (ref <5.7)
MEAN PLASMA GLUCOSE: 114 mg/dL

## 2015-10-29 LAB — COMPLETE METABOLIC PANEL WITH GFR
ALBUMIN: 3.9 g/dL (ref 3.6–5.1)
ALK PHOS: 56 U/L (ref 33–115)
ALT: 21 U/L (ref 6–29)
AST: 16 U/L (ref 10–30)
BILIRUBIN TOTAL: 0.3 mg/dL (ref 0.2–1.2)
BUN: 10 mg/dL (ref 7–25)
CO2: 25 mmol/L (ref 20–31)
CREATININE: 0.7 mg/dL (ref 0.50–1.10)
Calcium: 8.9 mg/dL (ref 8.6–10.2)
Chloride: 106 mmol/L (ref 98–110)
GLUCOSE: 93 mg/dL (ref 65–99)
Potassium: 4.5 mmol/L (ref 3.5–5.3)
Sodium: 140 mmol/L (ref 135–146)
TOTAL PROTEIN: 6.9 g/dL (ref 6.1–8.1)

## 2015-10-29 LAB — LIPID PANEL
CHOL/HDL RATIO: 3.7 ratio (ref <=5.0)
Cholesterol: 146 mg/dL (ref 125–200)
HDL: 39 mg/dL — AB (ref >=46)
LDL CALC: 78 mg/dL (ref <130)
Triglycerides: 145 mg/dL (ref <150)
VLDL: 29 mg/dL (ref <30)

## 2015-10-30 ENCOUNTER — Ambulatory Visit (INDEPENDENT_AMBULATORY_CARE_PROVIDER_SITE_OTHER): Payer: 59 | Admitting: Osteopathic Medicine

## 2015-10-30 VITALS — BP 130/70 | Temp 98.2°F

## 2015-10-30 DIAGNOSIS — Z23 Encounter for immunization: Secondary | ICD-10-CM | POA: Diagnosis not present

## 2015-10-30 DIAGNOSIS — Z111 Encounter for screening for respiratory tuberculosis: Secondary | ICD-10-CM

## 2015-10-30 LAB — TB SKIN TEST
Induration: 0 mm
TB Skin Test: NEGATIVE

## 2015-10-30 NOTE — Progress Notes (Signed)
Nurse notes reviewed, nothing to add.  BP 130/70   Temp 98.2 F (36.8 C) (Oral)    A/P Encounter for PPD skin test reading  Need for Tdap vaccination - Plan: Tdap vaccine greater than or equal to 27yo IM

## 2015-10-30 NOTE — Progress Notes (Signed)
Pt came to clinic for TB read. TB reading was negative and 0 mm.  Pt was notified that she is due for tdap and she agreed to have that done today. Pt tolerated injection well in the left deltoid without any complications. -EMH/RMA

## 2016-05-11 ENCOUNTER — Other Ambulatory Visit: Payer: Self-pay | Admitting: Osteopathic Medicine

## 2016-05-11 DIAGNOSIS — F410 Panic disorder [episodic paroxysmal anxiety] without agoraphobia: Secondary | ICD-10-CM

## 2016-05-11 DIAGNOSIS — F411 Generalized anxiety disorder: Secondary | ICD-10-CM

## 2017-01-30 ENCOUNTER — Other Ambulatory Visit: Payer: Self-pay | Admitting: Osteopathic Medicine

## 2017-01-30 DIAGNOSIS — F410 Panic disorder [episodic paroxysmal anxiety] without agoraphobia: Secondary | ICD-10-CM

## 2017-01-30 DIAGNOSIS — F411 Generalized anxiety disorder: Secondary | ICD-10-CM

## 2017-01-30 DIAGNOSIS — E282 Polycystic ovarian syndrome: Secondary | ICD-10-CM

## 2017-02-09 ENCOUNTER — Ambulatory Visit (INDEPENDENT_AMBULATORY_CARE_PROVIDER_SITE_OTHER): Payer: PRIVATE HEALTH INSURANCE | Admitting: Osteopathic Medicine

## 2017-02-09 ENCOUNTER — Encounter: Payer: Self-pay | Admitting: Osteopathic Medicine

## 2017-02-09 ENCOUNTER — Other Ambulatory Visit: Payer: Self-pay | Admitting: Osteopathic Medicine

## 2017-02-09 VITALS — BP 142/94 | HR 93 | Temp 98.4°F | Wt 214.0 lb

## 2017-02-09 DIAGNOSIS — F411 Generalized anxiety disorder: Secondary | ICD-10-CM

## 2017-02-09 DIAGNOSIS — Z3490 Encounter for supervision of normal pregnancy, unspecified, unspecified trimester: Secondary | ICD-10-CM

## 2017-02-09 DIAGNOSIS — E282 Polycystic ovarian syndrome: Secondary | ICD-10-CM

## 2017-02-09 DIAGNOSIS — Z3201 Encounter for pregnancy test, result positive: Secondary | ICD-10-CM | POA: Diagnosis not present

## 2017-02-09 LAB — POCT URINE PREGNANCY: Preg Test, Ur: POSITIVE — AB

## 2017-02-09 MED ORDER — PRENATAL VITAMIN PLUS LOW IRON 27-1 MG PO TABS: 1.0000 | ORAL_TABLET | Freq: Every day | ORAL | 3 refills | Status: DC

## 2017-02-09 MED ORDER — ONDANSETRON 4 MG PO TBDP: 8.0000 mg | ORAL_TABLET | Freq: Three times a day (TID) | ORAL | 1 refills | Status: DC | PRN

## 2017-02-09 NOTE — Progress Notes (Signed)
HPI: Jessica Cannon is a 27 y.o. female who  has a past medical history of Anxiety and Muscle ache.  [facility] today, 02/09/17,  for chief complaint of: Possibly pregnant  G2P2 ages 89 and [age] - locations with previous pregnancies Not sure of LMP, history of polycystic ovarian syndrome and irregular periods This is a happy surprise for her, no concerns about continuing pregnancy  PCOS - has been off of metformin, no history of prediabetes or gestational diabetes  Anxiety - anxious about current pregnancy but no major concerns or problems. She has been off of the Zoloft for some time and doing well, would like to stay off this medication if possible   Past medical, surgical, social and family history reviewed:  Patient Active Problem List   Diagnosis Date Noted  . PCOS (polycystic ovarian syndrome) 10/28/2015  . Acute stress reaction 04/30/2014  . GAD (generalized anxiety disorder) 04/30/2014  . Panic attack 04/30/2014  . Cystitis 11/16/2013  . Amenorrhea 09/26/2013  . ASTHMA, PERSISTENT, MILD 01/20/2008    Past Surgical History:  Procedure Laterality Date  . CESAREAN SECTION      Social History   Tobacco Use  . Smoking status: Former Smoker    Packs/day: 0.25    Years: 12.00    Pack years: 3.00    Types: Cigarettes    Last attempt to quit: 06/05/2015    Years since quitting: 1.6  . Smokeless tobacco: Never Used  Substance Use Topics  . Alcohol use: Yes    Comment: every few weeks, social drinker     Family History  Problem Relation Age of Onset  . Alcohol abuse Mother   . Hypertension Mother   . Diabetes Brother      Current medication list and allergy/intolerance information reviewed:    Current Outpatient Medications  Medication Sig Dispense Refill  . ALPRAZolam (XANAX) 0.5 MG tablet TAKE ONE TABLET BY MOUTH NO MORE THAN TWICE DAILY AS NEEDED FOR SEVERE ANXIETY. USE SPARINGLY TO PREVENT  TOLERANCE/DEPENDENCE. 30 tablet 0  . metFORMIN (GLUCOPHAGE-XR) 500 MG 24 hr tablet Take 1.5 tablets (750 mg total) by mouth 2 (two) times daily. 90 tablet 3  . sertraline (ZOLOFT) 100 MG tablet Take 1.5 tablets (150 mg total) by mouth daily. 135 tablet 3   No current facility-administered medications for this visit.     No Known Allergies    Review of Systems:  Constitutional:  No  fever, no chills  HEENT: No  headache  Cardiac: No  chest pain  Respiratory:  No  shortness of breath.   Gastrointestinal: No  abdominal pain, +nausea, +vomiting,  No  blood in stool, No  diarrhea, No  constipation   Skin: No  Rash  Genitourinary: No  incontinence, No  abnormal genital bleeding, No abnormal genital discharge  Psychiatric: No  concerns with depression, No  concerns with anxiety, No sleep problems, No mood problems  Exam:  BP (!) 148/68   Pulse 89   Temp 98.4 F (36.9 C) (Oral)   Wt 214 lb 0.6 oz (97.1 kg)   LMP  (LMP Unknown)   BMI 33.52 kg/m    Constitutional: VS see above. General Appearance: alert, well-developed, well-nourished, NAD  Eyes: Normal lids and conjunctive, non-icteric sclera  Ears, Nose, Mouth, Throat: MMM, Normal external inspection ears/nares/mouth/lips/gums.   Neck: No masses, trachea midline.   Respiratory: Normal respiratory effort. no wheeze, no rhonchi, no rales  Musculoskeletal: Gait normal.  Neurological: Normal balance/coordination. No tremor.   Skin: warm, dry, intact.   Psychiatric: Normal judgment/insight. Normal mood and affect. Oriented x3.    Results for orders placed or performed in visit on 02/09/17 (from the past 72 hour(s))  POCT urine pregnancy     Status: Abnormal   Collection Time: 02/09/17  1:14 PM  Result Value Ref Range   Preg Test, Ur Positive (A) Negative      ASSESSMENT/PLAN:   Pregnancy, unspecified gestational age - Plan: POCT urine pregnancy, Ambulatory referral to Obstetrics / Gynecology, [place] OP OB Comp Less  14 Wks  GAD (generalized anxiety disorder) - Okay to stay off of Zoloft, can restart if desired. Just let me know  PCOS (polycystic ovarian syndrome)      Visit summary with medication list and pertinent instructions was printed for patient to review. All questions at time of visit were answered - patient instructed to contact office with any additional concerns. ER/RTC precautions were reviewed with the patient.   Follow-up plan: Return for recheck as needed .   Please note: voice recognition software was used to produce this document, and typos may escape review. Please contact Dr. Lyn Hollingshead for any needed clarifications.

## 2017-02-10 ENCOUNTER — Ambulatory Visit (HOSPITAL_BASED_OUTPATIENT_CLINIC_OR_DEPARTMENT_OTHER)
Admission: RE | Admit: 2017-02-10 | Discharge: 2017-02-10 | Disposition: A | Discharge status: Home / Self Care | Payer: PRIVATE HEALTH INSURANCE | Source: Ambulatory Visit | Attending: Osteopathic Medicine | Admitting: Osteopathic Medicine

## 2017-02-10 DIAGNOSIS — Z3491 Encounter for supervision of normal pregnancy, unspecified, first trimester: Secondary | ICD-10-CM | POA: Diagnosis not present

## 2017-02-10 DIAGNOSIS — Z3490 Encounter for supervision of normal pregnancy, unspecified, unspecified trimester: Secondary | ICD-10-CM

## 2017-02-10 DIAGNOSIS — Z3A08 8 weeks gestation of pregnancy: Secondary | ICD-10-CM | POA: Diagnosis not present

## 2017-02-11 ENCOUNTER — Ambulatory Visit (INDEPENDENT_AMBULATORY_CARE_PROVIDER_SITE_OTHER): Payer: PRIVATE HEALTH INSURANCE | Admitting: Physician Assistant

## 2017-02-11 ENCOUNTER — Telehealth: Payer: Self-pay

## 2017-02-11 ENCOUNTER — Encounter: Payer: Self-pay | Admitting: Physician Assistant

## 2017-02-11 VITALS — BP 123/82 | HR 73 | Temp 98.1°F | Wt 217.0 lb

## 2017-02-11 DIAGNOSIS — O2311 Infections of bladder in pregnancy, first trimester: Secondary | ICD-10-CM

## 2017-02-11 DIAGNOSIS — Z23 Encounter for immunization: Secondary | ICD-10-CM | POA: Diagnosis not present

## 2017-02-11 DIAGNOSIS — R300 Dysuria: Secondary | ICD-10-CM

## 2017-02-11 LAB — POCT URINALYSIS DIPSTICK
Bilirubin, UA: NEGATIVE
Glucose, UA: NEGATIVE
Ketones, UA: NEGATIVE
Nitrite, UA: NEGATIVE
Protein, UA: 30
Spec Grav, UA: >=1.03 — AB
Urobilinogen, UA: 0.2 U/dL
pH, UA: 6

## 2017-02-11 MED ORDER — CEPHALEXIN 500 MG PO CAPS: 500.0000 mg | ORAL_CAPSULE | Freq: Two times a day (BID) | ORAL | 0 refills | Status: DC

## 2017-02-11 NOTE — Telephone Encounter (Signed)
For UTI in pregnancy I will want to give a urine sample for culture prior to starting antibiotics. Orders are in for her to go to the lab downstairs for clean catch. I'll send something to pharmacy

## 2017-02-11 NOTE — Telephone Encounter (Signed)
Pt called stating that she thinks she has a UTI. Requesting provider to send in a antibiotic rx or OTC recommendation that is safe to take during pregnancy. Pls advise, thanks.

## 2017-02-11 NOTE — Progress Notes (Signed)
HPI:                                                                Jessica Cannon is a [age] y.o. female who [name] today for dysuria  This is a 27 yo G3P2 1369w5d F, EDD 09/18/2017, presents with 2 days of suprapubic pressure and pain after voiding. She does endorse nausea, but she is [redacted] weeks pregnant. Denies fever, chills, flank pain, vomiting, or hematuria. Reports a history of recurrent UTI in pregnancy. Denies history of pyelonephritis.  No flowsheet data found.  No flowsheet data found.    Past Medical History:  Diagnosis Date  . Anxiety   . Muscle ache    Past Surgical History:  Procedure Laterality Date  . CESAREAN SECTION     Social History   Tobacco Use  . Smoking status: Former Smoker    Packs/day: 0.25    Years: 12.00    Pack years: 3.00    Types: Cigarettes    Last attempt to quit: 06/05/2015    Years since quitting: 1.6  . Smokeless tobacco: Never Used  Substance Use Topics  . Alcohol use: Yes    Comment: every few weeks, social drinker    family history includes Alcohol abuse in her mother; Diabetes in her brother; Hypertension in her mother.    ROS: negative except as noted in the HPI  Medications: Current Outpatient Medications  Medication Sig Dispense Refill  . cephALEXin (KEFLEX) 500 MG capsule Take 1 capsule (500 mg total) by mouth 2 (two) times daily. 14 capsule 0  . ondansetron (ZOFRAN-ODT) 4 MG disintegrating tablet Take 2 tablets (8 mg total) by mouth every 8 (eight) hours as needed for nausea or vomiting. 60 tablet 1  . Prenatal Vit-Fe Fumarate-FA (PRENATAL VITAMIN PLUS LOW IRON) 27-1 MG TABS Take 1 tablet by mouth daily. 90 tablet 3   No current facility-administered medications for this visit.    No Known Allergies     Objective:  BP 123/82   Pulse 73   Temp 98.1 F (36.7 C)   Wt 217 lb (98.4 kg)   LMP  (LMP Unknown)   BMI 33.99 kg/m  Gen:  alert, not ill-appearing,  no distress, appropriate for age, obese female HEENT: head normocephalic without obvious abnormality, conjunctiva and cornea clear, wearing glasses trachea midline Pulm: Normal work of breathing, normal phonation GI: suprapubic tenderness, no CVA tenderness Neuro: alert and oriented x 3, no tremor MSK: extremities atraumatic, normal gait and station Skin: intact, no rashes on exposed skin    No results found for this or any previous visit (from the past 72 hour(s)). No results found.    Assessment and Plan: 27 y.o. female with   1. Acute cystitis during pregnancy in first trimester - uncomplicated cystitis, no evidence of pyelonephritis - Keflex 500 mg bid x 7 days sent by her PCP - advised to avoid Azo - follow-up with OB in 2 weeks for pre-natal visit   2. Need for immunization against influenza - Flu Vaccine QUAD 36+ mos IM  Patient education and anticipatory guidance given Patient agrees with treatment plan Follow-up as needed if symptoms worsen or fail to improve  Levonne Hubertharley E. Clifford Benninger PA-C

## 2017-02-11 NOTE — Telephone Encounter (Signed)
Left a brief voicemail for patient to return call back to office regarding provider's note.

## 2017-02-11 NOTE — Patient Instructions (Signed)

## 2017-02-14 ENCOUNTER — Encounter: Payer: Self-pay | Admitting: Physician Assistant

## 2017-02-14 LAB — URINE CULTURE
MICRO NUMBER: 90167769
SPECIMEN QUALITY: ADEQUATE

## 2017-02-14 NOTE — Progress Notes (Signed)
Urine culture shows infection with Citrobacter If still having symptoms after Keflex, will need an alternative antibiotic.

## 2017-02-26 ENCOUNTER — Telehealth: Payer: Self-pay

## 2017-02-26 NOTE — Telephone Encounter (Signed)
Pregnant women need to be seen for this issue to recollect urine sample. Would have her follow up here or with her OB/GYN

## 2017-02-26 NOTE — Telephone Encounter (Signed)
Left vm msg for pt regarding provider's note.

## 2017-02-26 NOTE — Telephone Encounter (Signed)
Pt called stating that UTI symptoms are returning. She had a visit on 02/11/17 w/ Vinetta Bergamoharley and wants to know if there is any other antibiotics she can take during pregnancy. She finished the abx given but still having symptoms. Requesting feed back from pcp.

## 2019-12-27 ENCOUNTER — Encounter: Payer: Self-pay | Admitting: Osteopathic Medicine

## 2019-12-27 ENCOUNTER — Ambulatory Visit (INDEPENDENT_AMBULATORY_CARE_PROVIDER_SITE_OTHER): Payer: PRIVATE HEALTH INSURANCE | Admitting: Osteopathic Medicine

## 2019-12-27 VITALS — BP 132/65 | HR 91 | Temp 98.4°F | Wt 267.5 lb

## 2019-12-27 DIAGNOSIS — F411 Generalized anxiety disorder: Secondary | ICD-10-CM | POA: Diagnosis not present

## 2019-12-27 DIAGNOSIS — E282 Polycystic ovarian syndrome: Secondary | ICD-10-CM

## 2019-12-27 DIAGNOSIS — L729 Follicular cyst of the skin and subcutaneous tissue, unspecified: Secondary | ICD-10-CM

## 2019-12-27 DIAGNOSIS — R002 Palpitations: Secondary | ICD-10-CM

## 2019-12-27 DIAGNOSIS — Z23 Encounter for immunization: Secondary | ICD-10-CM

## 2019-12-27 DIAGNOSIS — G44209 Tension-type headache, unspecified, not intractable: Secondary | ICD-10-CM

## 2019-12-27 MED ORDER — SERTRALINE HCL 100 MG PO TABS: 100.0000 mg | ORAL_TABLET | Freq: Every day | ORAL | 1 refills | Status: AC

## 2019-12-27 NOTE — Progress Notes (Signed)
Jessica Cannon is a 29 y.o. female who presents to  [facility] at [facility]  today, 12/27/19, seeking care for the following:  . Anxiety - previously did well on Zoloft, would like to restart this. Is participating in counseling/therapy.  . Palpitations - occasional feeling heart razing, No dizzy.lightheaded. No presyncope. No CP/SOB. RRR, S1S2 WNL, CTABL . Headache - pressure front of forehead, daily, every since she was pregnant few years ago. No HTN. Occasional ibuprofen when it's bad. No eye exam in several years.  . Skin spot on back - small bump middle of upper thoracic area, to R of spine. Palpation c/w subq cyst      ASSESSMENT & PLAN with other pertinent findings:  The primary encounter diagnosis was GAD (generalized anxiety disorder). Diagnoses of Palpitations, PCOS (polycystic ovarian syndrome), Subcutaneous cyst, Tension headache, and Need for influenza vaccination were also pertinent to this visit.   No results found for this or any previous visit (from the past 24 hour(s)).     Patient Instructions  1. GAD (generalized anxiety disorder) Restart Zoloft at 50 mg and increase to 100 mg in a week or two   2. Palpitations EKG today normal Will get labs If labs all ok and treating anxiety doesn't help this, will get heart monitor   3. PCOS (polycystic ovarian syndrome) Will get labs - need to monitor sugars  Option to restart hormonal "birth control" pills for bad periods  4. Subcutaneous cyst On back - benign cyst, don't worry about this unless it becomes large or painful   5. Tension headache Vision screening today but eye exam recommended to recheck prescription for glasses Ibuprofen ok as needed but Excedrin Migraine which is aspirin + acetaminophen + caffeine might help more  Will also see if restarting Zoloft might help the headaches If not, will discuss medications to prevent tension headache   6. Need for influenza  vaccination Done today     Orders Placed This Encounter  Procedures  . Flu Vaccine QUAD 6+ mos PF IM (Fluarix Quad PF)  . CBC  . COMPLETE METABOLIC PANEL WITH GFR  . Lipid panel  . TSH  . Hemoglobin A1c  . EKG 12-Lead    Meds ordered this encounter  Medications  . sertraline (ZOLOFT) 100 MG tablet    Sig: Take 1 tablet (100 mg total) by mouth daily.    Dispense:  90 tablet    Refill:  1       Follow-up instructions: Return in about 6 weeks (around 02/07/2020) for recheck anxiety back on Zoloft, recheck headaches and palpitations .                                         BP 132/65   Pulse 91   Temp 98.4 F (36.9 C)   Wt 267 lb 8 oz (121.3 kg)   SpO2 100%   BMI 41.90 kg/m  RRR, S1S2 wnl CTABL No thyroid mass  No outpatient medications have been marked as taking for the 12/27/19 encounter (Office Visit) with Sunnie Nielsen, DO.    No results found for this or any previous visit (from the past 72 hour(s)).  No results found.     All questions at time of visit were answered - patient instructed to contact office with any additional concerns or updates.  ER/RTC precautions were reviewed with  the patient as applicable.   Please note: voice recognition software was used to produce this document, and typos may escape review. Please contact Dr. Lyn Hollingshead for any needed clarifications.

## 2019-12-27 NOTE — Patient Instructions (Signed)
1. GAD (generalized anxiety disorder) Restart Zoloft at 50 mg and increase to 100 mg in a week or two   2. Palpitations EKG today normal Will get labs If labs all ok and treating anxiety doesn't help this, will get heart monitor   3. PCOS (polycystic ovarian syndrome) Will get labs - need to monitor sugars  Option to restart hormonal "birth control" pills for bad periods  4. Subcutaneous cyst On back - benign cyst, don't worry about this unless it becomes large or painful   5. Tension headache Vision screening today but eye exam recommended to recheck prescription for glasses Ibuprofen ok as needed but Excedrin Migraine which is aspirin + acetaminophen + caffeine might help more  Will also see if restarting Zoloft might help the headaches If not, will discuss medications to prevent tension headache   6. Need for influenza vaccination Done today

## 2020-01-23 ENCOUNTER — Encounter: Payer: Self-pay | Admitting: Osteopathic Medicine

## 2020-02-07 ENCOUNTER — Ambulatory Visit: Payer: PRIVATE HEALTH INSURANCE | Admitting: Osteopathic Medicine

## 2020-02-21 ENCOUNTER — Ambulatory Visit: Payer: PRIVATE HEALTH INSURANCE | Admitting: Osteopathic Medicine

## 2020-03-06 ENCOUNTER — Ambulatory Visit: Payer: PRIVATE HEALTH INSURANCE | Admitting: Osteopathic Medicine

## 2020-04-09 ENCOUNTER — Encounter: Payer: Self-pay | Admitting: Osteopathic Medicine

## 2020-04-10 ENCOUNTER — Ambulatory Visit (INDEPENDENT_AMBULATORY_CARE_PROVIDER_SITE_OTHER): Payer: Medicaid Other | Admitting: Osteopathic Medicine

## 2020-04-10 ENCOUNTER — Other Ambulatory Visit: Payer: Self-pay

## 2020-04-10 ENCOUNTER — Encounter: Payer: Self-pay | Admitting: Osteopathic Medicine

## 2020-04-10 VITALS — BP 132/89 | HR 71 | Temp 98.4°F | Wt 264.0 lb

## 2020-04-10 DIAGNOSIS — T148XXA Other injury of unspecified body region, initial encounter: Secondary | ICD-10-CM | POA: Diagnosis not present

## 2020-04-10 DIAGNOSIS — R55 Syncope and collapse: Secondary | ICD-10-CM

## 2020-04-10 DIAGNOSIS — R002 Palpitations: Secondary | ICD-10-CM

## 2020-04-10 MED ORDER — CYCLOBENZAPRINE HCL 10 MG PO TABS: 5.0000 mg | ORAL_TABLET | Freq: Three times a day (TID) | ORAL | 1 refills | Status: AC | PRN

## 2020-04-10 MED ORDER — MELOXICAM 15 MG PO TABS: 15.0000 mg | ORAL_TABLET | Freq: Every day | ORAL | 0 refills | Status: AC

## 2020-04-10 NOTE — Patient Instructions (Addendum)
I think this was a vasovagal syncopal episode (passing out due to blood pressure drop - which can happen if dehydrated, sick, can be triggered by going tot he bathroom, can be due to abnormal heart rhythm, or sometimes we don't find a reason). I think you may have had a concussion which affected short term memory which is why you don't recall getting to where you were.   Plan: EKG today ok Labs today  Heart monitor - you should get a call to set this up If symptoms happen again, please seek emergency care ASAP

## 2020-04-10 NOTE — Progress Notes (Signed)
Jessica Cannon is a 30 y.o. female who presents to  [facility] at [facility]  today, 04/10/20, seeking care for the following:  . Syncopal episode, associated amnesia  MyChart message sent from patient: "[04/08/20] I felt terrible all day long. Seemed like just allergies to me really. Then this morning [04/09/20] I all the sudden got super lightheaded and nauseous. Got up from where I was and walked down the hall without even remembering it and fell in the floor in the hallway. Could just not feeling well be a reason to blackout and fall? Or could it be something completely unrelated to being sick?" Patient was prompted to schedule a visit here, presents today and confirms above history.    States she is no longer feeling any significant illness or lightheadedness sinus issues from allergies, overall feels normal.  No dizziness, no headache, no vision change..  Reports some musculoskeletal pain on right side from fall, some soreness in the wrist, shoulder, hip.  Reports occasional palpitations may be a couple of times per month.  No previous history of syncope/presyncope.  No chest pain on exertion, no significant psychiatric changes.  No recent changes to medications or other over-the-counter/recreational drugs taken.     ASSESSMENT & PLAN with other pertinent findings:  The primary encounter diagnosis was Palpitations. Diagnoses of Vasovagal syncope and Muscle strain were also pertinent to this visit.   My best guess at this point is vasovagal syncope with possible associated concussion causing some slight retrograde amnesia of the fall itself.  [place] head CT rule, no imaging required at this point but would have low threshold for getting MRI/neurology referral.  Patient Instructions  I think this was a vasovagal syncopal episode (passing out due to blood pressure drop - which can happen if dehydrated, sick, can be triggered by going tot he bathroom,  can be due to abnormal heart rhythm, or sometimes we don't find a reason). I think you may have had a concussion which affected short term memory which is why you don't recall getting to where you were.   Plan: EKG today ok Labs today  Heart monitor - you should get a call to set this up If symptoms happen again, please seek emergency care ASAP   Orders Placed This Encounter  Procedures  . CBC  . COMPLETE METABOLIC PANEL WITH GFR  . TSH  . Magnesium  . LONG TERM MONITOR (3-14 DAYS)  . EKG 12-Lead    Meds ordered this encounter  Medications  . meloxicam (MOBIC) 15 MG tablet    Sig: Take 1 tablet (15 mg total) by mouth daily. As needed aches/pains    Dispense:  30 tablet    Refill:  0  . cyclobenzaprine (FLEXERIL) 10 MG tablet    Sig: Take 0.5-1 tablets (5-10 mg total) by mouth 3 (three) times daily as needed for muscle spasms. Caution: can cause drowsiness    Dispense:  60 tablet    Refill:  1     See below for relevant physical exam findings  See below for recent lab and imaging results reviewed  Medications, allergies, PMH, PSH, SocH, FamH reviewed below    Follow-up instructions: Return if symptoms worsen or fail to improve.                                        Exam:  BP  132/89 (BP Location: Left Arm, Patient Position: Sitting, Cuff Size: Large)   Pulse 71   Temp 98.4 F (36.9 C) (Oral)   Wt 264 lb 0.6 oz (119.8 kg)   BMI 41.35 kg/m  No data found. Orthostatic vital signs: Lying down 137/82, pulse 71; sitting 131/89, pulse 82; standing 128/88, Pulse 84  Constitutional: VS see above. General Appearance: alert, well-developed, well-nourished, NAD  Neck: No masses, trachea midline.   Respiratory: Normal respiratory effort. no wheeze, no rhonchi, no rales  Cardiovascular: S1/S2 normal, no murmur, no rub/gallop auscultated. RRR.   Musculoskeletal: Gait normal. Symmetric and independent movement of all  extremities  Abdominal: non-tender, non-distended, no appreciable organomegaly, neg Murphy's, BS [id number]  Neurological: Normal balance/coordination. No tremor.  Skin: warm, dry, intact.   Psychiatric: Normal judgment/insight. Normal mood and affect. Oriented x3.   EKG interpretation: Rate: 64  Rhythm: sinus No ST/T changes concerning for acute ischemia/infarct  Previous EKG no change   Current Meds  Medication Sig  . cyclobenzaprine (FLEXERIL) 10 MG tablet Take 0.5-1 tablets (5-10 mg total) by mouth 3 (three) times daily as needed for muscle spasms. Caution: can cause drowsiness  . meloxicam (MOBIC) 15 MG tablet Take 1 tablet (15 mg total) by mouth daily. As needed aches/pains  . sertraline (ZOLOFT) 100 MG tablet Take 1 tablet (100 mg total) by mouth daily.    No Known Allergies  Patient Active Problem List   Diagnosis Date Noted  . PCOS (polycystic ovarian syndrome) 10/28/2015  . GAD (generalized anxiety disorder) 04/30/2014  . Panic attack 04/30/2014  . Amenorrhea 09/26/2013  . ASTHMA, PERSISTENT, MILD 01/20/2008    Family History  Problem Relation Age of Onset  . Alcohol abuse Mother   . Hypertension Mother   . Diabetes Brother     Social History   Tobacco Use  Smoking Status Former Smoker  . Packs/day: 0.25  . Years: 12.00  . Pack years: 3.00  . Types: Cigarettes  . Quit date: 06/05/2015  . Years since quitting: 4.8  Smokeless Tobacco Never Used    Past Surgical History:  Procedure Laterality Date  . CESAREAN SECTION      Immunization History  Administered Date(s) Administered  . Influenza, Seasonal, Injecte, Preservative Fre 10/04/2013  . Influenza,inj,Quad PF,6+ Mos 10/04/2013, 10/28/2015, 02/11/2017, 12/27/2019  . PPD Test 10/28/2015  . Tdap 10/30/2015    Recent Results (from the past 2160 hour(s))  CBC     Status: Abnormal   Collection Time: 04/10/20 12:00 AM  Result Value Ref Range   WBC 7.5 3.8 - 10.8 Thousand/uL   RBC 5.39 (H) 3.80 -  5.10 Million/uL   Hemoglobin 14.5 11.7 - 15.5 g/dL   HCT 07.3 71.0 - 62.6 %   MCV 83.3 80.0 - 100.0 fL   MCH 26.9 (L) 27.0 - 33.0 pg   MCHC 32.3 32.0 - 36.0 g/dL   RDW 94.8 54.6 - 27.0 %   Platelets 365 140 - 400 Thousand/uL   MPV 10.2 7.5 - 12.5 fL  COMPLETE METABOLIC PANEL WITH GFR     Status: None   Collection Time: 04/10/20 12:00 AM  Result Value Ref Range   Glucose, Bld 88 65 - 139 mg/dL    Comment: .        Non-fasting reference interval .    BUN 11 7 - 25 mg/dL   Creat 3.50 0.93 - 8.18 mg/dL   GFR, Est Non African American 101 > OR = 60 mL/min/1.77m2   GFR, Est African  American 117 > OR = 60 mL/min/1.66m2   BUN/Creatinine Ratio NOT APPLICABLE 6 - 22 (calc)   Sodium 141 135 - 146 mmol/L   Potassium 4.4 3.5 - 5.3 mmol/L   Chloride 104 98 - 110 mmol/L   CO2 29 20 - 32 mmol/L   Calcium 9.1 8.6 - 10.2 mg/dL   Total Protein 7.3 6.1 - 8.1 g/dL   Albumin 4.3 3.6 - 5.1 g/dL   Globulin 3.0 1.9 - 3.7 g/dL (calc)   AG Ratio 1.4 1.0 - 2.5 (calc)   Total Bilirubin 0.3 0.2 - 1.2 mg/dL   Alkaline phosphatase (APISO) 56 31 - 125 U/L   AST 17 10 - 30 U/L   ALT 20 6 - 29 U/L  TSH     Status: None   Collection Time: 04/10/20 12:00 AM  Result Value Ref Range   TSH 1.07 mIU/L    Comment:           Reference Range .           > or = 20 Years  0.40-4.50 .                Pregnancy Ranges           First trimester    0.26-2.66           Second trimester   0.55-2.73           Third trimester    0.43-2.91   Magnesium     Status: None   Collection Time: 04/10/20 12:00 AM  Result Value Ref Range   Magnesium 2.2 1.5 - 2.5 mg/dL    No results found.     All questions at time of visit were answered - patient instructed to contact office with any additional concerns or updates. ER/RTC precautions were reviewed with the patient as applicable.   Please note: manual typing as well as voice recognition software may have been used to produce this document - typos may escape review.  Please contact Dr. Lyn Hollingshead for any needed clarifications.

## 2020-04-11 LAB — COMPLETE METABOLIC PANEL WITH GFR
AG Ratio: 1.4 (calc) (ref 1.0–2.5)
ALT: 20 U/L (ref 6–29)
AST: 17 U/L (ref 10–30)
Albumin: 4.3 g/dL (ref 3.6–5.1)
Alkaline phosphatase (APISO): 56 U/L (ref 31–125)
BUN: 11 mg/dL (ref 7–25)
CO2: 29 mmol/L (ref 20–32)
Calcium: 9.1 mg/dL (ref 8.6–10.2)
Chloride: 104 mmol/L (ref 98–110)
Creat: 0.78 mg/dL (ref 0.50–1.10)
GFR, Est African American: 117 mL/min/{1.73_m2} (ref >=60)
GFR, Est Non African American: 101 mL/min/{1.73_m2} (ref >=60)
Globulin: 3 g/dL (calc) (ref 1.9–3.7)
Glucose, Bld: 88 mg/dL (ref 65–139)
Potassium: 4.4 mmol/L (ref 3.5–5.3)
Sodium: 141 mmol/L (ref 135–146)
Total Bilirubin: 0.3 mg/dL (ref 0.2–1.2)
Total Protein: 7.3 g/dL (ref 6.1–8.1)

## 2020-04-11 LAB — CBC
HCT: 44.9 % (ref 35.0–45.0)
Hemoglobin: 14.5 g/dL (ref 11.7–15.5)
MCH: 26.9 pg — ABNORMAL LOW (ref 27.0–33.0)
MCHC: 32.3 g/dL (ref 32.0–36.0)
MCV: 83.3 fL (ref 80.0–100.0)
MPV: 10.2 fL (ref 7.5–12.5)
Platelets: 365 10*3/uL (ref 140–400)
RBC: 5.39 10*6/uL — ABNORMAL HIGH (ref 3.80–5.10)
RDW: 13.7 % (ref 11.0–15.0)
WBC: 7.5 10*3/uL (ref 3.8–10.8)

## 2020-04-11 LAB — MAGNESIUM: Magnesium: 2.2 mg/dL (ref 1.5–2.5)

## 2020-04-11 LAB — TSH: TSH: 1.07 mIU/L

## 2020-04-15 ENCOUNTER — Other Ambulatory Visit: Payer: Self-pay | Admitting: *Deleted

## 2020-04-15 DIAGNOSIS — R002 Palpitations: Secondary | ICD-10-CM

## 2020-04-15 DIAGNOSIS — R55 Syncope and collapse: Secondary | ICD-10-CM

## 2020-04-16 ENCOUNTER — Ambulatory Visit (INDEPENDENT_AMBULATORY_CARE_PROVIDER_SITE_OTHER): Payer: Medicaid Other

## 2020-04-16 DIAGNOSIS — F320 Major depressive disorder, single episode, mild: Secondary | ICD-10-CM | POA: Diagnosis not present

## 2020-04-16 DIAGNOSIS — R55 Syncope and collapse: Secondary | ICD-10-CM

## 2020-04-16 DIAGNOSIS — R002 Palpitations: Secondary | ICD-10-CM

## 2020-04-16 DIAGNOSIS — F411 Generalized anxiety disorder: Secondary | ICD-10-CM | POA: Diagnosis not present

## 2020-04-18 DIAGNOSIS — R002 Palpitations: Secondary | ICD-10-CM | POA: Diagnosis not present

## 2020-04-18 DIAGNOSIS — R55 Syncope and collapse: Secondary | ICD-10-CM | POA: Diagnosis not present

## 2020-04-24 DIAGNOSIS — F411 Generalized anxiety disorder: Secondary | ICD-10-CM | POA: Diagnosis not present

## 2020-04-24 DIAGNOSIS — F320 Major depressive disorder, single episode, mild: Secondary | ICD-10-CM | POA: Diagnosis not present

## 2020-04-29 ENCOUNTER — Encounter: Payer: Self-pay | Admitting: Osteopathic Medicine

## 2020-05-01 DIAGNOSIS — F411 Generalized anxiety disorder: Secondary | ICD-10-CM | POA: Diagnosis not present

## 2020-05-01 DIAGNOSIS — F320 Major depressive disorder, single episode, mild: Secondary | ICD-10-CM | POA: Diagnosis not present

## 2020-05-08 DIAGNOSIS — F320 Major depressive disorder, single episode, mild: Secondary | ICD-10-CM | POA: Diagnosis not present

## 2020-05-08 DIAGNOSIS — F411 Generalized anxiety disorder: Secondary | ICD-10-CM | POA: Diagnosis not present

## 2020-05-14 DIAGNOSIS — F411 Generalized anxiety disorder: Secondary | ICD-10-CM | POA: Diagnosis not present

## 2020-05-14 DIAGNOSIS — F900 Attention-deficit hyperactivity disorder, predominantly inattentive type: Secondary | ICD-10-CM | POA: Diagnosis not present

## 2020-05-14 DIAGNOSIS — F410 Panic disorder [episodic paroxysmal anxiety] without agoraphobia: Secondary | ICD-10-CM | POA: Diagnosis not present

## 2020-05-14 DIAGNOSIS — F422 Mixed obsessional thoughts and acts: Secondary | ICD-10-CM | POA: Diagnosis not present

## 2020-05-15 DIAGNOSIS — F411 Generalized anxiety disorder: Secondary | ICD-10-CM | POA: Diagnosis not present

## 2020-05-15 DIAGNOSIS — F320 Major depressive disorder, single episode, mild: Secondary | ICD-10-CM | POA: Diagnosis not present

## 2020-05-22 DIAGNOSIS — F411 Generalized anxiety disorder: Secondary | ICD-10-CM | POA: Diagnosis not present

## 2020-05-22 DIAGNOSIS — F320 Major depressive disorder, single episode, mild: Secondary | ICD-10-CM | POA: Diagnosis not present

## 2020-05-29 DIAGNOSIS — F320 Major depressive disorder, single episode, mild: Secondary | ICD-10-CM | POA: Diagnosis not present

## 2020-05-29 DIAGNOSIS — F411 Generalized anxiety disorder: Secondary | ICD-10-CM | POA: Diagnosis not present

## 2020-06-05 DIAGNOSIS — F411 Generalized anxiety disorder: Secondary | ICD-10-CM | POA: Diagnosis not present

## 2020-06-05 DIAGNOSIS — F320 Major depressive disorder, single episode, mild: Secondary | ICD-10-CM | POA: Diagnosis not present

## 2020-06-12 DIAGNOSIS — F411 Generalized anxiety disorder: Secondary | ICD-10-CM | POA: Diagnosis not present

## 2020-06-12 DIAGNOSIS — F320 Major depressive disorder, single episode, mild: Secondary | ICD-10-CM | POA: Diagnosis not present

## 2020-06-13 DIAGNOSIS — F422 Mixed obsessional thoughts and acts: Secondary | ICD-10-CM | POA: Diagnosis not present

## 2020-06-13 DIAGNOSIS — F900 Attention-deficit hyperactivity disorder, predominantly inattentive type: Secondary | ICD-10-CM | POA: Diagnosis not present

## 2020-06-13 DIAGNOSIS — F410 Panic disorder [episodic paroxysmal anxiety] without agoraphobia: Secondary | ICD-10-CM | POA: Diagnosis not present

## 2020-06-13 DIAGNOSIS — F411 Generalized anxiety disorder: Secondary | ICD-10-CM | POA: Diagnosis not present

## 2020-06-19 DIAGNOSIS — F320 Major depressive disorder, single episode, mild: Secondary | ICD-10-CM | POA: Diagnosis not present

## 2020-06-19 DIAGNOSIS — F411 Generalized anxiety disorder: Secondary | ICD-10-CM | POA: Diagnosis not present

## 2020-06-26 DIAGNOSIS — F411 Generalized anxiety disorder: Secondary | ICD-10-CM | POA: Diagnosis not present

## 2020-06-26 DIAGNOSIS — F320 Major depressive disorder, single episode, mild: Secondary | ICD-10-CM | POA: Diagnosis not present

## 2020-07-03 DIAGNOSIS — F320 Major depressive disorder, single episode, mild: Secondary | ICD-10-CM | POA: Diagnosis not present

## 2020-07-03 DIAGNOSIS — F411 Generalized anxiety disorder: Secondary | ICD-10-CM | POA: Diagnosis not present

## 2020-07-10 DIAGNOSIS — F320 Major depressive disorder, single episode, mild: Secondary | ICD-10-CM | POA: Diagnosis not present

## 2020-07-10 DIAGNOSIS — F411 Generalized anxiety disorder: Secondary | ICD-10-CM | POA: Diagnosis not present

## 2020-07-16 DIAGNOSIS — F410 Panic disorder [episodic paroxysmal anxiety] without agoraphobia: Secondary | ICD-10-CM | POA: Diagnosis not present

## 2020-07-16 DIAGNOSIS — F422 Mixed obsessional thoughts and acts: Secondary | ICD-10-CM | POA: Diagnosis not present

## 2020-07-16 DIAGNOSIS — F411 Generalized anxiety disorder: Secondary | ICD-10-CM | POA: Diagnosis not present

## 2020-07-16 DIAGNOSIS — F900 Attention-deficit hyperactivity disorder, predominantly inattentive type: Secondary | ICD-10-CM | POA: Diagnosis not present

## 2020-07-17 DIAGNOSIS — F320 Major depressive disorder, single episode, mild: Secondary | ICD-10-CM | POA: Diagnosis not present

## 2020-07-17 DIAGNOSIS — F411 Generalized anxiety disorder: Secondary | ICD-10-CM | POA: Diagnosis not present

## 2020-07-21 DIAGNOSIS — H5213 Myopia, bilateral: Secondary | ICD-10-CM | POA: Diagnosis not present

## 2020-07-24 DIAGNOSIS — F320 Major depressive disorder, single episode, mild: Secondary | ICD-10-CM | POA: Diagnosis not present

## 2020-07-24 DIAGNOSIS — F411 Generalized anxiety disorder: Secondary | ICD-10-CM | POA: Diagnosis not present

## 2020-07-31 DIAGNOSIS — F320 Major depressive disorder, single episode, mild: Secondary | ICD-10-CM | POA: Diagnosis not present

## 2020-07-31 DIAGNOSIS — F411 Generalized anxiety disorder: Secondary | ICD-10-CM | POA: Diagnosis not present

## 2020-08-07 DIAGNOSIS — F411 Generalized anxiety disorder: Secondary | ICD-10-CM | POA: Diagnosis not present

## 2020-08-07 DIAGNOSIS — F320 Major depressive disorder, single episode, mild: Secondary | ICD-10-CM | POA: Diagnosis not present

## 2020-08-13 DIAGNOSIS — F422 Mixed obsessional thoughts and acts: Secondary | ICD-10-CM | POA: Diagnosis not present

## 2020-08-13 DIAGNOSIS — F411 Generalized anxiety disorder: Secondary | ICD-10-CM | POA: Diagnosis not present

## 2020-08-13 DIAGNOSIS — F900 Attention-deficit hyperactivity disorder, predominantly inattentive type: Secondary | ICD-10-CM | POA: Diagnosis not present

## 2020-08-13 DIAGNOSIS — F410 Panic disorder [episodic paroxysmal anxiety] without agoraphobia: Secondary | ICD-10-CM | POA: Diagnosis not present

## 2020-08-14 DIAGNOSIS — F411 Generalized anxiety disorder: Secondary | ICD-10-CM | POA: Diagnosis not present

## 2020-08-14 DIAGNOSIS — F320 Major depressive disorder, single episode, mild: Secondary | ICD-10-CM | POA: Diagnosis not present

## 2020-08-21 DIAGNOSIS — F411 Generalized anxiety disorder: Secondary | ICD-10-CM | POA: Diagnosis not present

## 2020-08-21 DIAGNOSIS — F320 Major depressive disorder, single episode, mild: Secondary | ICD-10-CM | POA: Diagnosis not present

## 2020-08-28 DIAGNOSIS — F411 Generalized anxiety disorder: Secondary | ICD-10-CM | POA: Diagnosis not present

## 2020-08-28 DIAGNOSIS — F320 Major depressive disorder, single episode, mild: Secondary | ICD-10-CM | POA: Diagnosis not present

## 2020-09-04 DIAGNOSIS — F411 Generalized anxiety disorder: Secondary | ICD-10-CM | POA: Diagnosis not present

## 2020-09-04 DIAGNOSIS — F320 Major depressive disorder, single episode, mild: Secondary | ICD-10-CM | POA: Diagnosis not present

## 2020-09-17 DIAGNOSIS — F410 Panic disorder [episodic paroxysmal anxiety] without agoraphobia: Secondary | ICD-10-CM | POA: Diagnosis not present

## 2020-09-17 DIAGNOSIS — F422 Mixed obsessional thoughts and acts: Secondary | ICD-10-CM | POA: Diagnosis not present

## 2020-09-17 DIAGNOSIS — F900 Attention-deficit hyperactivity disorder, predominantly inattentive type: Secondary | ICD-10-CM | POA: Diagnosis not present

## 2020-09-17 DIAGNOSIS — F411 Generalized anxiety disorder: Secondary | ICD-10-CM | POA: Diagnosis not present

## 2020-09-18 DIAGNOSIS — F411 Generalized anxiety disorder: Secondary | ICD-10-CM | POA: Diagnosis not present

## 2020-09-18 DIAGNOSIS — F320 Major depressive disorder, single episode, mild: Secondary | ICD-10-CM | POA: Diagnosis not present

## 2020-09-25 DIAGNOSIS — F411 Generalized anxiety disorder: Secondary | ICD-10-CM | POA: Diagnosis not present

## 2020-09-25 DIAGNOSIS — F320 Major depressive disorder, single episode, mild: Secondary | ICD-10-CM | POA: Diagnosis not present

## 2020-10-02 DIAGNOSIS — F411 Generalized anxiety disorder: Secondary | ICD-10-CM | POA: Diagnosis not present

## 2020-10-09 DIAGNOSIS — F411 Generalized anxiety disorder: Secondary | ICD-10-CM | POA: Diagnosis not present

## 2020-10-14 DIAGNOSIS — F411 Generalized anxiety disorder: Secondary | ICD-10-CM | POA: Diagnosis not present

## 2020-10-15 DIAGNOSIS — F422 Mixed obsessional thoughts and acts: Secondary | ICD-10-CM | POA: Diagnosis not present

## 2020-10-15 DIAGNOSIS — F900 Attention-deficit hyperactivity disorder, predominantly inattentive type: Secondary | ICD-10-CM | POA: Diagnosis not present

## 2020-10-15 DIAGNOSIS — F410 Panic disorder [episodic paroxysmal anxiety] without agoraphobia: Secondary | ICD-10-CM | POA: Diagnosis not present

## 2020-10-15 DIAGNOSIS — F411 Generalized anxiety disorder: Secondary | ICD-10-CM | POA: Diagnosis not present

## 2020-10-21 DIAGNOSIS — F411 Generalized anxiety disorder: Secondary | ICD-10-CM | POA: Diagnosis not present

## 2020-10-23 DIAGNOSIS — M25552 Pain in left hip: Secondary | ICD-10-CM | POA: Diagnosis not present

## 2020-10-23 DIAGNOSIS — Z7689 Persons encountering health services in other specified circumstances: Secondary | ICD-10-CM | POA: Diagnosis not present

## 2020-10-23 DIAGNOSIS — L680 Hirsutism: Secondary | ICD-10-CM | POA: Diagnosis not present

## 2020-10-23 DIAGNOSIS — M545 Low back pain, unspecified: Secondary | ICD-10-CM | POA: Diagnosis not present

## 2020-10-23 DIAGNOSIS — R200 Anesthesia of skin: Secondary | ICD-10-CM | POA: Diagnosis not present

## 2020-10-23 DIAGNOSIS — G8929 Other chronic pain: Secondary | ICD-10-CM | POA: Diagnosis not present

## 2020-10-23 DIAGNOSIS — R6889 Other general symptoms and signs: Secondary | ICD-10-CM | POA: Diagnosis not present

## 2020-10-23 DIAGNOSIS — F909 Attention-deficit hyperactivity disorder, unspecified type: Secondary | ICD-10-CM | POA: Diagnosis not present

## 2020-10-23 DIAGNOSIS — F5101 Primary insomnia: Secondary | ICD-10-CM | POA: Diagnosis not present

## 2020-10-23 DIAGNOSIS — G5603 Carpal tunnel syndrome, bilateral upper limbs: Secondary | ICD-10-CM | POA: Diagnosis not present

## 2020-10-23 DIAGNOSIS — F411 Generalized anxiety disorder: Secondary | ICD-10-CM | POA: Diagnosis not present

## 2020-10-23 DIAGNOSIS — R0781 Pleurodynia: Secondary | ICD-10-CM | POA: Diagnosis not present

## 2020-10-23 DIAGNOSIS — M5430 Sciatica, unspecified side: Secondary | ICD-10-CM | POA: Diagnosis not present

## 2020-10-23 DIAGNOSIS — M25551 Pain in right hip: Secondary | ICD-10-CM | POA: Diagnosis not present

## 2020-10-23 DIAGNOSIS — M4602 Spinal enthesopathy, cervical region: Secondary | ICD-10-CM | POA: Diagnosis not present

## 2020-10-23 DIAGNOSIS — M5136 Other intervertebral disc degeneration, lumbar region: Secondary | ICD-10-CM | POA: Diagnosis not present

## 2020-10-23 DIAGNOSIS — G44219 Episodic tension-type headache, not intractable: Secondary | ICD-10-CM | POA: Diagnosis not present

## 2020-10-23 DIAGNOSIS — M5441 Lumbago with sciatica, right side: Secondary | ICD-10-CM | POA: Diagnosis not present

## 2020-10-30 DIAGNOSIS — F411 Generalized anxiety disorder: Secondary | ICD-10-CM | POA: Diagnosis not present

## 2020-11-19 DIAGNOSIS — F411 Generalized anxiety disorder: Secondary | ICD-10-CM | POA: Diagnosis not present

## 2020-11-19 DIAGNOSIS — F410 Panic disorder [episodic paroxysmal anxiety] without agoraphobia: Secondary | ICD-10-CM | POA: Diagnosis not present

## 2020-11-19 DIAGNOSIS — F422 Mixed obsessional thoughts and acts: Secondary | ICD-10-CM | POA: Diagnosis not present

## 2020-11-19 DIAGNOSIS — F900 Attention-deficit hyperactivity disorder, predominantly inattentive type: Secondary | ICD-10-CM | POA: Diagnosis not present

## 2020-11-20 DIAGNOSIS — F411 Generalized anxiety disorder: Secondary | ICD-10-CM | POA: Diagnosis not present

## 2020-11-25 DIAGNOSIS — F411 Generalized anxiety disorder: Secondary | ICD-10-CM | POA: Diagnosis not present

## 2020-11-27 DIAGNOSIS — E559 Vitamin D deficiency, unspecified: Secondary | ICD-10-CM | POA: Diagnosis not present

## 2020-11-27 DIAGNOSIS — Z833 Family history of diabetes mellitus: Secondary | ICD-10-CM | POA: Diagnosis not present

## 2020-11-27 DIAGNOSIS — R7303 Prediabetes: Secondary | ICD-10-CM | POA: Diagnosis not present

## 2020-11-27 DIAGNOSIS — E611 Iron deficiency: Secondary | ICD-10-CM | POA: Diagnosis not present

## 2020-11-27 DIAGNOSIS — R519 Headache, unspecified: Secondary | ICD-10-CM | POA: Diagnosis not present

## 2020-12-04 DIAGNOSIS — R202 Paresthesia of skin: Secondary | ICD-10-CM | POA: Diagnosis not present

## 2020-12-04 DIAGNOSIS — R200 Anesthesia of skin: Secondary | ICD-10-CM | POA: Diagnosis not present

## 2020-12-11 DIAGNOSIS — F411 Generalized anxiety disorder: Secondary | ICD-10-CM | POA: Diagnosis not present

## 2020-12-13 DIAGNOSIS — M461 Sacroiliitis, not elsewhere classified: Secondary | ICD-10-CM | POA: Diagnosis not present

## 2020-12-13 DIAGNOSIS — M5442 Lumbago with sciatica, left side: Secondary | ICD-10-CM | POA: Diagnosis not present

## 2020-12-13 DIAGNOSIS — M5441 Lumbago with sciatica, right side: Secondary | ICD-10-CM | POA: Diagnosis not present

## 2020-12-13 DIAGNOSIS — G8929 Other chronic pain: Secondary | ICD-10-CM | POA: Diagnosis not present

## 2020-12-17 DIAGNOSIS — F900 Attention-deficit hyperactivity disorder, predominantly inattentive type: Secondary | ICD-10-CM | POA: Diagnosis not present

## 2020-12-17 DIAGNOSIS — F422 Mixed obsessional thoughts and acts: Secondary | ICD-10-CM | POA: Diagnosis not present

## 2020-12-17 DIAGNOSIS — F410 Panic disorder [episodic paroxysmal anxiety] without agoraphobia: Secondary | ICD-10-CM | POA: Diagnosis not present

## 2020-12-17 DIAGNOSIS — F411 Generalized anxiety disorder: Secondary | ICD-10-CM | POA: Diagnosis not present

## 2020-12-18 DIAGNOSIS — F411 Generalized anxiety disorder: Secondary | ICD-10-CM | POA: Diagnosis not present

## 2020-12-25 DIAGNOSIS — F411 Generalized anxiety disorder: Secondary | ICD-10-CM | POA: Diagnosis not present

## 2021-01-07 DIAGNOSIS — F411 Generalized anxiety disorder: Secondary | ICD-10-CM | POA: Diagnosis not present

## 2021-01-14 DIAGNOSIS — F411 Generalized anxiety disorder: Secondary | ICD-10-CM | POA: Diagnosis not present

## 2021-01-16 DIAGNOSIS — Z6841 Body Mass Index (BMI) 40.0 and over, adult: Secondary | ICD-10-CM | POA: Diagnosis not present

## 2021-01-16 DIAGNOSIS — I10 Essential (primary) hypertension: Secondary | ICD-10-CM | POA: Diagnosis not present

## 2021-01-16 DIAGNOSIS — G4710 Hypersomnia, unspecified: Secondary | ICD-10-CM | POA: Diagnosis not present

## 2021-01-16 DIAGNOSIS — G43009 Migraine without aura, not intractable, without status migrainosus: Secondary | ICD-10-CM | POA: Diagnosis not present

## 2021-01-16 DIAGNOSIS — R0683 Snoring: Secondary | ICD-10-CM | POA: Diagnosis not present

## 2021-01-21 DIAGNOSIS — F411 Generalized anxiety disorder: Secondary | ICD-10-CM | POA: Diagnosis not present

## 2021-01-21 DIAGNOSIS — F410 Panic disorder [episodic paroxysmal anxiety] without agoraphobia: Secondary | ICD-10-CM | POA: Diagnosis not present

## 2021-01-21 DIAGNOSIS — F422 Mixed obsessional thoughts and acts: Secondary | ICD-10-CM | POA: Diagnosis not present

## 2021-01-21 DIAGNOSIS — F900 Attention-deficit hyperactivity disorder, predominantly inattentive type: Secondary | ICD-10-CM | POA: Diagnosis not present

## 2021-01-22 DIAGNOSIS — E538 Deficiency of other specified B group vitamins: Secondary | ICD-10-CM | POA: Diagnosis not present

## 2021-01-22 DIAGNOSIS — Z0000 Encounter for general adult medical examination without abnormal findings: Secondary | ICD-10-CM | POA: Diagnosis not present

## 2021-01-22 DIAGNOSIS — Z6841 Body Mass Index (BMI) 40.0 and over, adult: Secondary | ICD-10-CM | POA: Diagnosis not present

## 2021-01-22 DIAGNOSIS — E559 Vitamin D deficiency, unspecified: Secondary | ICD-10-CM | POA: Diagnosis not present

## 2021-01-22 DIAGNOSIS — Z124 Encounter for screening for malignant neoplasm of cervix: Secondary | ICD-10-CM | POA: Diagnosis not present

## 2021-01-27 DIAGNOSIS — M5416 Radiculopathy, lumbar region: Secondary | ICD-10-CM | POA: Diagnosis not present

## 2021-01-27 DIAGNOSIS — M533 Sacrococcygeal disorders, not elsewhere classified: Secondary | ICD-10-CM | POA: Diagnosis not present

## 2021-01-29 DIAGNOSIS — M6281 Muscle weakness (generalized): Secondary | ICD-10-CM | POA: Diagnosis not present

## 2021-01-29 DIAGNOSIS — M5441 Lumbago with sciatica, right side: Secondary | ICD-10-CM | POA: Diagnosis not present

## 2021-01-29 DIAGNOSIS — G8929 Other chronic pain: Secondary | ICD-10-CM | POA: Diagnosis not present

## 2021-01-29 DIAGNOSIS — R6889 Other general symptoms and signs: Secondary | ICD-10-CM | POA: Diagnosis not present

## 2021-01-29 DIAGNOSIS — M6289 Other specified disorders of muscle: Secondary | ICD-10-CM | POA: Diagnosis not present

## 2021-01-29 DIAGNOSIS — M5442 Lumbago with sciatica, left side: Secondary | ICD-10-CM | POA: Diagnosis not present

## 2021-01-30 DIAGNOSIS — F411 Generalized anxiety disorder: Secondary | ICD-10-CM | POA: Diagnosis not present

## 2021-02-04 DIAGNOSIS — F411 Generalized anxiety disorder: Secondary | ICD-10-CM | POA: Diagnosis not present

## 2021-02-05 DIAGNOSIS — M5387 Other specified dorsopathies, lumbosacral region: Secondary | ICD-10-CM | POA: Diagnosis not present

## 2021-02-05 DIAGNOSIS — M5442 Lumbago with sciatica, left side: Secondary | ICD-10-CM | POA: Diagnosis not present

## 2021-02-05 DIAGNOSIS — M6281 Muscle weakness (generalized): Secondary | ICD-10-CM | POA: Diagnosis not present

## 2021-02-05 DIAGNOSIS — R293 Abnormal posture: Secondary | ICD-10-CM | POA: Diagnosis not present

## 2021-02-05 DIAGNOSIS — M5441 Lumbago with sciatica, right side: Secondary | ICD-10-CM | POA: Diagnosis not present

## 2021-02-05 DIAGNOSIS — G8929 Other chronic pain: Secondary | ICD-10-CM | POA: Diagnosis not present

## 2021-02-11 DIAGNOSIS — F411 Generalized anxiety disorder: Secondary | ICD-10-CM | POA: Diagnosis not present

## 2021-02-12 DIAGNOSIS — R293 Abnormal posture: Secondary | ICD-10-CM | POA: Diagnosis not present

## 2021-02-12 DIAGNOSIS — M5387 Other specified dorsopathies, lumbosacral region: Secondary | ICD-10-CM | POA: Diagnosis not present

## 2021-02-12 DIAGNOSIS — M5442 Lumbago with sciatica, left side: Secondary | ICD-10-CM | POA: Diagnosis not present

## 2021-02-12 DIAGNOSIS — M5441 Lumbago with sciatica, right side: Secondary | ICD-10-CM | POA: Diagnosis not present

## 2021-02-12 DIAGNOSIS — G8929 Other chronic pain: Secondary | ICD-10-CM | POA: Diagnosis not present

## 2021-02-12 DIAGNOSIS — M6281 Muscle weakness (generalized): Secondary | ICD-10-CM | POA: Diagnosis not present

## 2021-02-17 DIAGNOSIS — G5603 Carpal tunnel syndrome, bilateral upper limbs: Secondary | ICD-10-CM | POA: Diagnosis not present

## 2021-02-18 DIAGNOSIS — F411 Generalized anxiety disorder: Secondary | ICD-10-CM | POA: Diagnosis not present

## 2021-02-19 DIAGNOSIS — M5387 Other specified dorsopathies, lumbosacral region: Secondary | ICD-10-CM | POA: Diagnosis not present

## 2021-02-19 DIAGNOSIS — G8929 Other chronic pain: Secondary | ICD-10-CM | POA: Diagnosis not present

## 2021-02-19 DIAGNOSIS — M6281 Muscle weakness (generalized): Secondary | ICD-10-CM | POA: Diagnosis not present

## 2021-02-19 DIAGNOSIS — M5442 Lumbago with sciatica, left side: Secondary | ICD-10-CM | POA: Diagnosis not present

## 2021-02-19 DIAGNOSIS — M5441 Lumbago with sciatica, right side: Secondary | ICD-10-CM | POA: Diagnosis not present

## 2021-02-19 DIAGNOSIS — R293 Abnormal posture: Secondary | ICD-10-CM | POA: Diagnosis not present

## 2021-02-20 DIAGNOSIS — F900 Attention-deficit hyperactivity disorder, predominantly inattentive type: Secondary | ICD-10-CM | POA: Diagnosis not present

## 2021-02-20 DIAGNOSIS — F422 Mixed obsessional thoughts and acts: Secondary | ICD-10-CM | POA: Diagnosis not present

## 2021-02-20 DIAGNOSIS — F411 Generalized anxiety disorder: Secondary | ICD-10-CM | POA: Diagnosis not present

## 2021-02-20 DIAGNOSIS — F410 Panic disorder [episodic paroxysmal anxiety] without agoraphobia: Secondary | ICD-10-CM | POA: Diagnosis not present

## 2021-02-24 DIAGNOSIS — M533 Sacrococcygeal disorders, not elsewhere classified: Secondary | ICD-10-CM | POA: Diagnosis not present

## 2021-02-24 DIAGNOSIS — G5603 Carpal tunnel syndrome, bilateral upper limbs: Secondary | ICD-10-CM | POA: Diagnosis not present

## 2021-02-25 DIAGNOSIS — F411 Generalized anxiety disorder: Secondary | ICD-10-CM | POA: Diagnosis not present

## 2021-02-26 DIAGNOSIS — M5442 Lumbago with sciatica, left side: Secondary | ICD-10-CM | POA: Diagnosis not present

## 2021-02-26 DIAGNOSIS — M5441 Lumbago with sciatica, right side: Secondary | ICD-10-CM | POA: Diagnosis not present

## 2021-02-26 DIAGNOSIS — M5387 Other specified dorsopathies, lumbosacral region: Secondary | ICD-10-CM | POA: Diagnosis not present

## 2021-02-26 DIAGNOSIS — G8929 Other chronic pain: Secondary | ICD-10-CM | POA: Diagnosis not present

## 2021-02-26 DIAGNOSIS — M6281 Muscle weakness (generalized): Secondary | ICD-10-CM | POA: Diagnosis not present

## 2021-02-26 DIAGNOSIS — R293 Abnormal posture: Secondary | ICD-10-CM | POA: Diagnosis not present

## 2021-03-06 DIAGNOSIS — F411 Generalized anxiety disorder: Secondary | ICD-10-CM | POA: Diagnosis not present

## 2021-03-11 DIAGNOSIS — F411 Generalized anxiety disorder: Secondary | ICD-10-CM | POA: Diagnosis not present

## 2021-03-18 DIAGNOSIS — F411 Generalized anxiety disorder: Secondary | ICD-10-CM | POA: Diagnosis not present

## 2021-03-20 DIAGNOSIS — F900 Attention-deficit hyperactivity disorder, predominantly inattentive type: Secondary | ICD-10-CM | POA: Diagnosis not present

## 2021-03-20 DIAGNOSIS — F422 Mixed obsessional thoughts and acts: Secondary | ICD-10-CM | POA: Diagnosis not present

## 2021-03-20 DIAGNOSIS — F410 Panic disorder [episodic paroxysmal anxiety] without agoraphobia: Secondary | ICD-10-CM | POA: Diagnosis not present

## 2021-03-20 DIAGNOSIS — F411 Generalized anxiety disorder: Secondary | ICD-10-CM | POA: Diagnosis not present

## 2021-03-25 DIAGNOSIS — F411 Generalized anxiety disorder: Secondary | ICD-10-CM | POA: Diagnosis not present

## 2021-04-01 DIAGNOSIS — F411 Generalized anxiety disorder: Secondary | ICD-10-CM | POA: Diagnosis not present

## 2021-04-08 DIAGNOSIS — F411 Generalized anxiety disorder: Secondary | ICD-10-CM | POA: Diagnosis not present

## 2021-04-15 DIAGNOSIS — F411 Generalized anxiety disorder: Secondary | ICD-10-CM | POA: Diagnosis not present

## 2021-04-18 DIAGNOSIS — G4710 Hypersomnia, unspecified: Secondary | ICD-10-CM | POA: Diagnosis not present

## 2021-04-18 DIAGNOSIS — N926 Irregular menstruation, unspecified: Secondary | ICD-10-CM | POA: Diagnosis not present

## 2021-04-18 DIAGNOSIS — Z9189 Other specified personal risk factors, not elsewhere classified: Secondary | ICD-10-CM | POA: Diagnosis not present

## 2021-04-18 DIAGNOSIS — I10 Essential (primary) hypertension: Secondary | ICD-10-CM | POA: Diagnosis not present

## 2021-04-18 DIAGNOSIS — G43009 Migraine without aura, not intractable, without status migrainosus: Secondary | ICD-10-CM | POA: Diagnosis not present

## 2021-04-22 DIAGNOSIS — F411 Generalized anxiety disorder: Secondary | ICD-10-CM | POA: Diagnosis not present

## 2021-04-22 DIAGNOSIS — F410 Panic disorder [episodic paroxysmal anxiety] without agoraphobia: Secondary | ICD-10-CM | POA: Diagnosis not present

## 2021-04-22 DIAGNOSIS — F422 Mixed obsessional thoughts and acts: Secondary | ICD-10-CM | POA: Diagnosis not present

## 2021-04-22 DIAGNOSIS — F900 Attention-deficit hyperactivity disorder, predominantly inattentive type: Secondary | ICD-10-CM | POA: Diagnosis not present

## 2021-04-29 DIAGNOSIS — F411 Generalized anxiety disorder: Secondary | ICD-10-CM | POA: Diagnosis not present

## 2021-04-30 DIAGNOSIS — E538 Deficiency of other specified B group vitamins: Secondary | ICD-10-CM | POA: Diagnosis not present

## 2021-04-30 DIAGNOSIS — E611 Iron deficiency: Secondary | ICD-10-CM | POA: Diagnosis not present

## 2021-04-30 DIAGNOSIS — R5383 Other fatigue: Secondary | ICD-10-CM | POA: Diagnosis not present

## 2021-04-30 DIAGNOSIS — R519 Headache, unspecified: Secondary | ICD-10-CM | POA: Diagnosis not present

## 2021-04-30 DIAGNOSIS — E559 Vitamin D deficiency, unspecified: Secondary | ICD-10-CM | POA: Diagnosis not present

## 2021-04-30 DIAGNOSIS — R7303 Prediabetes: Secondary | ICD-10-CM | POA: Diagnosis not present

## 2021-05-06 DIAGNOSIS — R0683 Snoring: Secondary | ICD-10-CM | POA: Diagnosis not present

## 2021-05-06 DIAGNOSIS — Z72820 Sleep deprivation: Secondary | ICD-10-CM | POA: Diagnosis not present

## 2021-05-06 DIAGNOSIS — G4710 Hypersomnia, unspecified: Secondary | ICD-10-CM | POA: Diagnosis not present

## 2021-05-07 DIAGNOSIS — M5416 Radiculopathy, lumbar region: Secondary | ICD-10-CM | POA: Diagnosis not present

## 2021-05-07 DIAGNOSIS — M533 Sacrococcygeal disorders, not elsewhere classified: Secondary | ICD-10-CM | POA: Diagnosis not present

## 2021-05-08 DIAGNOSIS — R0683 Snoring: Secondary | ICD-10-CM | POA: Diagnosis not present

## 2021-05-08 DIAGNOSIS — M7541 Impingement syndrome of right shoulder: Secondary | ICD-10-CM | POA: Diagnosis not present

## 2021-05-08 DIAGNOSIS — M25511 Pain in right shoulder: Secondary | ICD-10-CM | POA: Diagnosis not present

## 2021-05-08 DIAGNOSIS — Z72820 Sleep deprivation: Secondary | ICD-10-CM | POA: Diagnosis not present

## 2021-05-08 DIAGNOSIS — G4710 Hypersomnia, unspecified: Secondary | ICD-10-CM | POA: Diagnosis not present

## 2021-05-08 DIAGNOSIS — G4733 Obstructive sleep apnea (adult) (pediatric): Secondary | ICD-10-CM | POA: Diagnosis not present

## 2021-05-13 DIAGNOSIS — F411 Generalized anxiety disorder: Secondary | ICD-10-CM | POA: Diagnosis not present

## 2021-05-14 DIAGNOSIS — I10 Essential (primary) hypertension: Secondary | ICD-10-CM | POA: Diagnosis not present

## 2021-05-14 DIAGNOSIS — G4710 Hypersomnia, unspecified: Secondary | ICD-10-CM | POA: Diagnosis not present

## 2021-05-14 DIAGNOSIS — G4733 Obstructive sleep apnea (adult) (pediatric): Secondary | ICD-10-CM | POA: Diagnosis not present

## 2021-05-20 DIAGNOSIS — F411 Generalized anxiety disorder: Secondary | ICD-10-CM | POA: Diagnosis not present

## 2021-05-21 DIAGNOSIS — G5603 Carpal tunnel syndrome, bilateral upper limbs: Secondary | ICD-10-CM | POA: Diagnosis not present

## 2021-05-22 DIAGNOSIS — E282 Polycystic ovarian syndrome: Secondary | ICD-10-CM | POA: Diagnosis not present

## 2021-05-22 DIAGNOSIS — N912 Amenorrhea, unspecified: Secondary | ICD-10-CM | POA: Diagnosis not present

## 2021-05-22 DIAGNOSIS — N926 Irregular menstruation, unspecified: Secondary | ICD-10-CM | POA: Diagnosis not present

## 2021-05-22 DIAGNOSIS — L680 Hirsutism: Secondary | ICD-10-CM | POA: Diagnosis not present

## 2021-05-22 DIAGNOSIS — R519 Headache, unspecified: Secondary | ICD-10-CM | POA: Diagnosis not present

## 2021-05-26 DIAGNOSIS — E538 Deficiency of other specified B group vitamins: Secondary | ICD-10-CM | POA: Diagnosis not present

## 2021-05-26 DIAGNOSIS — F411 Generalized anxiety disorder: Secondary | ICD-10-CM | POA: Diagnosis not present

## 2021-05-26 DIAGNOSIS — E559 Vitamin D deficiency, unspecified: Secondary | ICD-10-CM | POA: Diagnosis not present

## 2021-05-27 DIAGNOSIS — G4733 Obstructive sleep apnea (adult) (pediatric): Secondary | ICD-10-CM | POA: Diagnosis not present

## 2021-05-27 DIAGNOSIS — F411 Generalized anxiety disorder: Secondary | ICD-10-CM | POA: Diagnosis not present

## 2021-05-28 DIAGNOSIS — Z7984 Long term (current) use of oral hypoglycemic drugs: Secondary | ICD-10-CM | POA: Diagnosis not present

## 2021-05-28 DIAGNOSIS — F411 Generalized anxiety disorder: Secondary | ICD-10-CM | POA: Diagnosis not present

## 2021-05-28 DIAGNOSIS — E282 Polycystic ovarian syndrome: Secondary | ICD-10-CM | POA: Diagnosis not present

## 2021-05-28 DIAGNOSIS — G5603 Carpal tunnel syndrome, bilateral upper limbs: Secondary | ICD-10-CM | POA: Diagnosis not present

## 2021-05-28 DIAGNOSIS — Z8616 Personal history of COVID-19: Secondary | ICD-10-CM | POA: Diagnosis not present

## 2021-05-28 DIAGNOSIS — Z6841 Body Mass Index (BMI) 40.0 and over, adult: Secondary | ICD-10-CM | POA: Diagnosis not present

## 2021-05-28 DIAGNOSIS — F32A Depression, unspecified: Secondary | ICD-10-CM | POA: Diagnosis not present

## 2021-05-28 DIAGNOSIS — K219 Gastro-esophageal reflux disease without esophagitis: Secondary | ICD-10-CM | POA: Diagnosis not present

## 2021-05-28 DIAGNOSIS — J45909 Unspecified asthma, uncomplicated: Secondary | ICD-10-CM | POA: Diagnosis not present

## 2021-05-28 DIAGNOSIS — G5602 Carpal tunnel syndrome, left upper limb: Secondary | ICD-10-CM | POA: Diagnosis not present

## 2021-05-28 DIAGNOSIS — G4733 Obstructive sleep apnea (adult) (pediatric): Secondary | ICD-10-CM | POA: Diagnosis not present

## 2021-05-28 DIAGNOSIS — Z79899 Other long term (current) drug therapy: Secondary | ICD-10-CM | POA: Diagnosis not present

## 2021-05-28 DIAGNOSIS — F17210 Nicotine dependence, cigarettes, uncomplicated: Secondary | ICD-10-CM | POA: Diagnosis not present

## 2021-05-29 DIAGNOSIS — M4727 Other spondylosis with radiculopathy, lumbosacral region: Secondary | ICD-10-CM | POA: Diagnosis not present

## 2021-05-29 DIAGNOSIS — R519 Headache, unspecified: Secondary | ICD-10-CM | POA: Diagnosis not present

## 2021-05-29 DIAGNOSIS — M5116 Intervertebral disc disorders with radiculopathy, lumbar region: Secondary | ICD-10-CM | POA: Diagnosis not present

## 2021-05-29 DIAGNOSIS — R9082 White matter disease, unspecified: Secondary | ICD-10-CM | POA: Diagnosis not present

## 2021-05-29 DIAGNOSIS — M5117 Intervertebral disc disorders with radiculopathy, lumbosacral region: Secondary | ICD-10-CM | POA: Diagnosis not present

## 2021-05-29 DIAGNOSIS — M4726 Other spondylosis with radiculopathy, lumbar region: Secondary | ICD-10-CM | POA: Diagnosis not present

## 2021-05-29 DIAGNOSIS — M5127 Other intervertebral disc displacement, lumbosacral region: Secondary | ICD-10-CM | POA: Diagnosis not present

## 2021-05-29 DIAGNOSIS — M5136 Other intervertebral disc degeneration, lumbar region: Secondary | ICD-10-CM | POA: Diagnosis not present

## 2021-05-29 DIAGNOSIS — G8929 Other chronic pain: Secondary | ICD-10-CM | POA: Diagnosis not present

## 2021-06-03 DIAGNOSIS — F411 Generalized anxiety disorder: Secondary | ICD-10-CM | POA: Diagnosis not present

## 2021-06-05 DIAGNOSIS — F422 Mixed obsessional thoughts and acts: Secondary | ICD-10-CM | POA: Diagnosis not present

## 2021-06-05 DIAGNOSIS — F900 Attention-deficit hyperactivity disorder, predominantly inattentive type: Secondary | ICD-10-CM | POA: Diagnosis not present

## 2021-06-05 DIAGNOSIS — F410 Panic disorder [episodic paroxysmal anxiety] without agoraphobia: Secondary | ICD-10-CM | POA: Diagnosis not present

## 2021-06-05 DIAGNOSIS — F411 Generalized anxiety disorder: Secondary | ICD-10-CM | POA: Diagnosis not present

## 2021-06-10 DIAGNOSIS — E282 Polycystic ovarian syndrome: Secondary | ICD-10-CM | POA: Diagnosis not present

## 2021-06-10 DIAGNOSIS — F17210 Nicotine dependence, cigarettes, uncomplicated: Secondary | ICD-10-CM | POA: Diagnosis not present

## 2021-06-10 DIAGNOSIS — Z87891 Personal history of nicotine dependence: Secondary | ICD-10-CM | POA: Diagnosis not present

## 2021-06-10 DIAGNOSIS — M5416 Radiculopathy, lumbar region: Secondary | ICD-10-CM | POA: Diagnosis not present

## 2021-06-10 DIAGNOSIS — G4733 Obstructive sleep apnea (adult) (pediatric): Secondary | ICD-10-CM | POA: Diagnosis not present

## 2021-06-10 DIAGNOSIS — Z7984 Long term (current) use of oral hypoglycemic drugs: Secondary | ICD-10-CM | POA: Diagnosis not present

## 2021-06-10 DIAGNOSIS — F411 Generalized anxiety disorder: Secondary | ICD-10-CM | POA: Diagnosis not present

## 2021-06-10 DIAGNOSIS — Z6841 Body Mass Index (BMI) 40.0 and over, adult: Secondary | ICD-10-CM | POA: Diagnosis not present

## 2021-06-10 DIAGNOSIS — Z79899 Other long term (current) drug therapy: Secondary | ICD-10-CM | POA: Diagnosis not present

## 2021-06-10 DIAGNOSIS — G5603 Carpal tunnel syndrome, bilateral upper limbs: Secondary | ICD-10-CM | POA: Diagnosis not present

## 2021-06-10 DIAGNOSIS — G5601 Carpal tunnel syndrome, right upper limb: Secondary | ICD-10-CM | POA: Diagnosis not present

## 2021-06-17 DIAGNOSIS — F411 Generalized anxiety disorder: Secondary | ICD-10-CM | POA: Diagnosis not present

## 2021-06-23 DIAGNOSIS — G43009 Migraine without aura, not intractable, without status migrainosus: Secondary | ICD-10-CM | POA: Diagnosis not present

## 2021-06-23 DIAGNOSIS — R7303 Prediabetes: Secondary | ICD-10-CM | POA: Diagnosis not present

## 2021-06-23 DIAGNOSIS — G4733 Obstructive sleep apnea (adult) (pediatric): Secondary | ICD-10-CM | POA: Diagnosis not present

## 2021-06-23 DIAGNOSIS — I10 Essential (primary) hypertension: Secondary | ICD-10-CM | POA: Diagnosis not present

## 2021-06-24 DIAGNOSIS — F411 Generalized anxiety disorder: Secondary | ICD-10-CM | POA: Diagnosis not present

## 2021-06-26 DIAGNOSIS — E538 Deficiency of other specified B group vitamins: Secondary | ICD-10-CM | POA: Diagnosis not present

## 2021-06-27 DIAGNOSIS — G4733 Obstructive sleep apnea (adult) (pediatric): Secondary | ICD-10-CM | POA: Diagnosis not present

## 2021-07-01 DIAGNOSIS — F411 Generalized anxiety disorder: Secondary | ICD-10-CM | POA: Diagnosis not present

## 2021-07-03 DIAGNOSIS — M7521 Bicipital tendinitis, right shoulder: Secondary | ICD-10-CM | POA: Diagnosis not present

## 2021-07-08 DIAGNOSIS — F411 Generalized anxiety disorder: Secondary | ICD-10-CM | POA: Diagnosis not present

## 2021-07-15 DIAGNOSIS — F411 Generalized anxiety disorder: Secondary | ICD-10-CM | POA: Diagnosis not present

## 2021-07-16 DIAGNOSIS — G4733 Obstructive sleep apnea (adult) (pediatric): Secondary | ICD-10-CM | POA: Diagnosis not present

## 2021-07-16 DIAGNOSIS — Z9989 Dependence on other enabling machines and devices: Secondary | ICD-10-CM | POA: Diagnosis not present

## 2021-07-22 DIAGNOSIS — F900 Attention-deficit hyperactivity disorder, predominantly inattentive type: Secondary | ICD-10-CM | POA: Diagnosis not present

## 2021-07-22 DIAGNOSIS — F422 Mixed obsessional thoughts and acts: Secondary | ICD-10-CM | POA: Diagnosis not present

## 2021-07-22 DIAGNOSIS — F410 Panic disorder [episodic paroxysmal anxiety] without agoraphobia: Secondary | ICD-10-CM | POA: Diagnosis not present

## 2021-07-22 DIAGNOSIS — F411 Generalized anxiety disorder: Secondary | ICD-10-CM | POA: Diagnosis not present

## 2021-07-24 DIAGNOSIS — M62511 Muscle wasting and atrophy, not elsewhere classified, right shoulder: Secondary | ICD-10-CM | POA: Diagnosis not present

## 2021-07-24 DIAGNOSIS — R600 Localized edema: Secondary | ICD-10-CM | POA: Diagnosis not present

## 2021-07-24 DIAGNOSIS — M25811 Other specified joint disorders, right shoulder: Secondary | ICD-10-CM | POA: Diagnosis not present

## 2021-07-27 DIAGNOSIS — G4733 Obstructive sleep apnea (adult) (pediatric): Secondary | ICD-10-CM | POA: Diagnosis not present

## 2021-07-29 DIAGNOSIS — E538 Deficiency of other specified B group vitamins: Secondary | ICD-10-CM | POA: Diagnosis not present

## 2021-07-29 DIAGNOSIS — F411 Generalized anxiety disorder: Secondary | ICD-10-CM | POA: Diagnosis not present

## 2021-07-29 DIAGNOSIS — R7303 Prediabetes: Secondary | ICD-10-CM | POA: Diagnosis not present

## 2021-08-05 DIAGNOSIS — F411 Generalized anxiety disorder: Secondary | ICD-10-CM | POA: Diagnosis not present

## 2021-08-07 DIAGNOSIS — N926 Irregular menstruation, unspecified: Secondary | ICD-10-CM | POA: Diagnosis not present

## 2021-08-07 DIAGNOSIS — G43829 Menstrual migraine, not intractable, without status migrainosus: Secondary | ICD-10-CM | POA: Diagnosis not present

## 2021-08-07 DIAGNOSIS — M19011 Primary osteoarthritis, right shoulder: Secondary | ICD-10-CM | POA: Diagnosis not present

## 2021-08-07 DIAGNOSIS — Z3041 Encounter for surveillance of contraceptive pills: Secondary | ICD-10-CM | POA: Diagnosis not present

## 2021-08-12 DIAGNOSIS — F411 Generalized anxiety disorder: Secondary | ICD-10-CM | POA: Diagnosis not present

## 2021-08-19 DIAGNOSIS — F410 Panic disorder [episodic paroxysmal anxiety] without agoraphobia: Secondary | ICD-10-CM | POA: Diagnosis not present

## 2021-08-19 DIAGNOSIS — F411 Generalized anxiety disorder: Secondary | ICD-10-CM | POA: Diagnosis not present

## 2021-08-19 DIAGNOSIS — F422 Mixed obsessional thoughts and acts: Secondary | ICD-10-CM | POA: Diagnosis not present

## 2021-08-19 DIAGNOSIS — F900 Attention-deficit hyperactivity disorder, predominantly inattentive type: Secondary | ICD-10-CM | POA: Diagnosis not present

## 2021-08-27 DIAGNOSIS — G4733 Obstructive sleep apnea (adult) (pediatric): Secondary | ICD-10-CM | POA: Diagnosis not present

## 2021-09-09 DIAGNOSIS — J029 Acute pharyngitis, unspecified: Secondary | ICD-10-CM | POA: Diagnosis not present

## 2021-09-09 DIAGNOSIS — Z20822 Contact with and (suspected) exposure to covid-19: Secondary | ICD-10-CM | POA: Diagnosis not present

## 2021-09-09 DIAGNOSIS — R519 Headache, unspecified: Secondary | ICD-10-CM | POA: Diagnosis not present

## 2021-09-11 DIAGNOSIS — E538 Deficiency of other specified B group vitamins: Secondary | ICD-10-CM | POA: Diagnosis not present

## 2021-09-11 DIAGNOSIS — E611 Iron deficiency: Secondary | ICD-10-CM | POA: Diagnosis not present

## 2021-09-11 DIAGNOSIS — E559 Vitamin D deficiency, unspecified: Secondary | ICD-10-CM | POA: Diagnosis not present

## 2021-09-11 DIAGNOSIS — R5383 Other fatigue: Secondary | ICD-10-CM | POA: Diagnosis not present

## 2021-09-11 DIAGNOSIS — M2550 Pain in unspecified joint: Secondary | ICD-10-CM | POA: Diagnosis not present

## 2021-09-11 DIAGNOSIS — R52 Pain, unspecified: Secondary | ICD-10-CM | POA: Diagnosis not present

## 2021-09-11 DIAGNOSIS — R109 Unspecified abdominal pain: Secondary | ICD-10-CM | POA: Diagnosis not present

## 2021-09-11 DIAGNOSIS — G43829 Menstrual migraine, not intractable, without status migrainosus: Secondary | ICD-10-CM | POA: Diagnosis not present

## 2021-09-11 DIAGNOSIS — R635 Abnormal weight gain: Secondary | ICD-10-CM | POA: Diagnosis not present

## 2021-09-16 DIAGNOSIS — F900 Attention-deficit hyperactivity disorder, predominantly inattentive type: Secondary | ICD-10-CM | POA: Diagnosis not present

## 2021-09-16 DIAGNOSIS — F422 Mixed obsessional thoughts and acts: Secondary | ICD-10-CM | POA: Diagnosis not present

## 2021-09-16 DIAGNOSIS — F411 Generalized anxiety disorder: Secondary | ICD-10-CM | POA: Diagnosis not present

## 2021-09-16 DIAGNOSIS — Z713 Dietary counseling and surveillance: Secondary | ICD-10-CM | POA: Diagnosis not present

## 2021-09-16 DIAGNOSIS — F410 Panic disorder [episodic paroxysmal anxiety] without agoraphobia: Secondary | ICD-10-CM | POA: Diagnosis not present

## 2021-09-16 DIAGNOSIS — Z7182 Exercise counseling: Secondary | ICD-10-CM | POA: Diagnosis not present

## 2021-09-27 DIAGNOSIS — G4733 Obstructive sleep apnea (adult) (pediatric): Secondary | ICD-10-CM | POA: Diagnosis not present

## 2021-09-30 DIAGNOSIS — F411 Generalized anxiety disorder: Secondary | ICD-10-CM | POA: Diagnosis not present

## 2021-10-07 DIAGNOSIS — F411 Generalized anxiety disorder: Secondary | ICD-10-CM | POA: Diagnosis not present

## 2021-10-14 DIAGNOSIS — F422 Mixed obsessional thoughts and acts: Secondary | ICD-10-CM | POA: Diagnosis not present

## 2021-10-14 DIAGNOSIS — F410 Panic disorder [episodic paroxysmal anxiety] without agoraphobia: Secondary | ICD-10-CM | POA: Diagnosis not present

## 2021-10-14 DIAGNOSIS — F900 Attention-deficit hyperactivity disorder, predominantly inattentive type: Secondary | ICD-10-CM | POA: Diagnosis not present

## 2021-10-14 DIAGNOSIS — F411 Generalized anxiety disorder: Secondary | ICD-10-CM | POA: Diagnosis not present

## 2021-10-16 DIAGNOSIS — E6609 Other obesity due to excess calories: Secondary | ICD-10-CM | POA: Diagnosis not present

## 2021-10-16 DIAGNOSIS — Z6841 Body Mass Index (BMI) 40.0 and over, adult: Secondary | ICD-10-CM | POA: Diagnosis not present

## 2021-10-16 DIAGNOSIS — G4733 Obstructive sleep apnea (adult) (pediatric): Secondary | ICD-10-CM | POA: Diagnosis not present

## 2021-10-16 DIAGNOSIS — Z713 Dietary counseling and surveillance: Secondary | ICD-10-CM | POA: Diagnosis not present

## 2021-10-21 DIAGNOSIS — F411 Generalized anxiety disorder: Secondary | ICD-10-CM | POA: Diagnosis not present

## 2021-10-27 DIAGNOSIS — G4733 Obstructive sleep apnea (adult) (pediatric): Secondary | ICD-10-CM | POA: Diagnosis not present

## 2021-10-28 DIAGNOSIS — R6883 Chills (without fever): Secondary | ICD-10-CM | POA: Diagnosis not present

## 2021-10-28 DIAGNOSIS — R109 Unspecified abdominal pain: Secondary | ICD-10-CM | POA: Diagnosis not present

## 2021-10-28 DIAGNOSIS — R300 Dysuria: Secondary | ICD-10-CM | POA: Diagnosis not present

## 2021-10-28 DIAGNOSIS — N3001 Acute cystitis with hematuria: Secondary | ICD-10-CM | POA: Diagnosis not present

## 2021-11-04 DIAGNOSIS — F411 Generalized anxiety disorder: Secondary | ICD-10-CM | POA: Diagnosis not present

## 2021-11-11 DIAGNOSIS — F900 Attention-deficit hyperactivity disorder, predominantly inattentive type: Secondary | ICD-10-CM | POA: Diagnosis not present

## 2021-11-11 DIAGNOSIS — F329 Major depressive disorder, single episode, unspecified: Secondary | ICD-10-CM | POA: Diagnosis not present

## 2021-11-11 DIAGNOSIS — F411 Generalized anxiety disorder: Secondary | ICD-10-CM | POA: Diagnosis not present

## 2021-11-11 DIAGNOSIS — F410 Panic disorder [episodic paroxysmal anxiety] without agoraphobia: Secondary | ICD-10-CM | POA: Diagnosis not present

## 2021-11-18 DIAGNOSIS — F411 Generalized anxiety disorder: Secondary | ICD-10-CM | POA: Diagnosis not present

## 2021-11-25 DIAGNOSIS — F411 Generalized anxiety disorder: Secondary | ICD-10-CM | POA: Diagnosis not present

## 2021-11-27 DIAGNOSIS — G4733 Obstructive sleep apnea (adult) (pediatric): Secondary | ICD-10-CM | POA: Diagnosis not present

## 2021-12-01 DIAGNOSIS — M2559 Pain in other specified joint: Secondary | ICD-10-CM | POA: Diagnosis not present

## 2021-12-01 DIAGNOSIS — R682 Dry mouth, unspecified: Secondary | ICD-10-CM | POA: Diagnosis not present

## 2021-12-02 DIAGNOSIS — F411 Generalized anxiety disorder: Secondary | ICD-10-CM | POA: Diagnosis not present

## 2021-12-09 DIAGNOSIS — F411 Generalized anxiety disorder: Secondary | ICD-10-CM | POA: Diagnosis not present

## 2021-12-16 DIAGNOSIS — F411 Generalized anxiety disorder: Secondary | ICD-10-CM | POA: Diagnosis not present

## 2021-12-27 DIAGNOSIS — G4733 Obstructive sleep apnea (adult) (pediatric): Secondary | ICD-10-CM | POA: Diagnosis not present

## 2022-01-06 DIAGNOSIS — F411 Generalized anxiety disorder: Secondary | ICD-10-CM | POA: Diagnosis not present

## 2022-01-07 DIAGNOSIS — M797 Fibromyalgia: Secondary | ICD-10-CM | POA: Diagnosis not present

## 2022-01-07 DIAGNOSIS — M2550 Pain in unspecified joint: Secondary | ICD-10-CM | POA: Diagnosis not present

## 2022-01-13 DIAGNOSIS — F411 Generalized anxiety disorder: Secondary | ICD-10-CM | POA: Diagnosis not present

## 2022-01-14 DIAGNOSIS — M2550 Pain in unspecified joint: Secondary | ICD-10-CM | POA: Diagnosis not present

## 2022-01-14 DIAGNOSIS — M797 Fibromyalgia: Secondary | ICD-10-CM | POA: Diagnosis not present

## 2022-01-20 DIAGNOSIS — G4733 Obstructive sleep apnea (adult) (pediatric): Secondary | ICD-10-CM | POA: Diagnosis not present

## 2022-01-20 DIAGNOSIS — J029 Acute pharyngitis, unspecified: Secondary | ICD-10-CM | POA: Diagnosis not present

## 2022-01-20 DIAGNOSIS — R519 Headache, unspecified: Secondary | ICD-10-CM | POA: Diagnosis not present

## 2022-01-20 DIAGNOSIS — J019 Acute sinusitis, unspecified: Secondary | ICD-10-CM | POA: Diagnosis not present

## 2022-01-20 DIAGNOSIS — M2550 Pain in unspecified joint: Secondary | ICD-10-CM | POA: Diagnosis not present

## 2022-01-20 DIAGNOSIS — R051 Acute cough: Secondary | ICD-10-CM | POA: Diagnosis not present

## 2022-01-20 DIAGNOSIS — J3489 Other specified disorders of nose and nasal sinuses: Secondary | ICD-10-CM | POA: Diagnosis not present

## 2022-01-20 DIAGNOSIS — M797 Fibromyalgia: Secondary | ICD-10-CM | POA: Diagnosis not present

## 2022-01-20 DIAGNOSIS — R0981 Nasal congestion: Secondary | ICD-10-CM | POA: Diagnosis not present

## 2022-01-27 DIAGNOSIS — M2550 Pain in unspecified joint: Secondary | ICD-10-CM | POA: Diagnosis not present

## 2022-01-27 DIAGNOSIS — M797 Fibromyalgia: Secondary | ICD-10-CM | POA: Diagnosis not present

## 2022-01-27 DIAGNOSIS — G4733 Obstructive sleep apnea (adult) (pediatric): Secondary | ICD-10-CM | POA: Diagnosis not present

## 2022-01-27 DIAGNOSIS — F411 Generalized anxiety disorder: Secondary | ICD-10-CM | POA: Diagnosis not present

## 2022-02-03 DIAGNOSIS — M797 Fibromyalgia: Secondary | ICD-10-CM | POA: Diagnosis not present

## 2022-02-03 DIAGNOSIS — M2550 Pain in unspecified joint: Secondary | ICD-10-CM | POA: Diagnosis not present

## 2022-02-10 DIAGNOSIS — F411 Generalized anxiety disorder: Secondary | ICD-10-CM | POA: Diagnosis not present

## 2022-02-11 DIAGNOSIS — R7303 Prediabetes: Secondary | ICD-10-CM | POA: Diagnosis not present

## 2022-02-11 DIAGNOSIS — Z0000 Encounter for general adult medical examination without abnormal findings: Secondary | ICD-10-CM | POA: Diagnosis not present

## 2022-02-11 DIAGNOSIS — E785 Hyperlipidemia, unspecified: Secondary | ICD-10-CM | POA: Diagnosis not present

## 2022-02-11 DIAGNOSIS — Z1330 Encounter for screening examination for mental health and behavioral disorders, unspecified: Secondary | ICD-10-CM | POA: Diagnosis not present

## 2022-02-17 DIAGNOSIS — M2550 Pain in unspecified joint: Secondary | ICD-10-CM | POA: Diagnosis not present

## 2022-02-17 DIAGNOSIS — M797 Fibromyalgia: Secondary | ICD-10-CM | POA: Diagnosis not present

## 2022-02-24 DIAGNOSIS — F411 Generalized anxiety disorder: Secondary | ICD-10-CM | POA: Diagnosis not present

## 2022-02-27 DIAGNOSIS — G4733 Obstructive sleep apnea (adult) (pediatric): Secondary | ICD-10-CM | POA: Diagnosis not present

## 2022-03-04 DIAGNOSIS — M2550 Pain in unspecified joint: Secondary | ICD-10-CM | POA: Diagnosis not present

## 2022-03-04 DIAGNOSIS — M797 Fibromyalgia: Secondary | ICD-10-CM | POA: Diagnosis not present

## 2022-03-11 DIAGNOSIS — M797 Fibromyalgia: Secondary | ICD-10-CM | POA: Diagnosis not present

## 2022-03-11 DIAGNOSIS — M2550 Pain in unspecified joint: Secondary | ICD-10-CM | POA: Diagnosis not present

## 2022-03-16 DIAGNOSIS — G4733 Obstructive sleep apnea (adult) (pediatric): Secondary | ICD-10-CM | POA: Diagnosis not present

## 2022-03-16 DIAGNOSIS — G43009 Migraine without aura, not intractable, without status migrainosus: Secondary | ICD-10-CM | POA: Diagnosis not present

## 2022-03-16 DIAGNOSIS — I10 Essential (primary) hypertension: Secondary | ICD-10-CM | POA: Diagnosis not present

## 2022-03-24 DIAGNOSIS — M25512 Pain in left shoulder: Secondary | ICD-10-CM | POA: Diagnosis not present

## 2022-03-24 DIAGNOSIS — M19012 Primary osteoarthritis, left shoulder: Secondary | ICD-10-CM | POA: Diagnosis not present

## 2022-03-24 DIAGNOSIS — F411 Generalized anxiety disorder: Secondary | ICD-10-CM | POA: Diagnosis not present

## 2022-03-24 DIAGNOSIS — M19011 Primary osteoarthritis, right shoulder: Secondary | ICD-10-CM | POA: Diagnosis not present

## 2022-03-24 DIAGNOSIS — M7541 Impingement syndrome of right shoulder: Secondary | ICD-10-CM | POA: Diagnosis not present

## 2022-03-28 DIAGNOSIS — G4733 Obstructive sleep apnea (adult) (pediatric): Secondary | ICD-10-CM | POA: Diagnosis not present

## 2022-04-16 DIAGNOSIS — G4733 Obstructive sleep apnea (adult) (pediatric): Secondary | ICD-10-CM | POA: Diagnosis not present

## 2022-04-16 DIAGNOSIS — G43009 Migraine without aura, not intractable, without status migrainosus: Secondary | ICD-10-CM | POA: Diagnosis not present

## 2022-04-21 DIAGNOSIS — F411 Generalized anxiety disorder: Secondary | ICD-10-CM | POA: Diagnosis not present

## 2022-05-05 DIAGNOSIS — F411 Generalized anxiety disorder: Secondary | ICD-10-CM | POA: Diagnosis not present

## 2022-05-19 DIAGNOSIS — F411 Generalized anxiety disorder: Secondary | ICD-10-CM | POA: Diagnosis not present

## 2022-06-01 DIAGNOSIS — F411 Generalized anxiety disorder: Secondary | ICD-10-CM | POA: Diagnosis not present

## 2022-06-02 DIAGNOSIS — R300 Dysuria: Secondary | ICD-10-CM | POA: Diagnosis not present

## 2022-06-02 DIAGNOSIS — R3915 Urgency of urination: Secondary | ICD-10-CM | POA: Diagnosis not present

## 2022-06-02 DIAGNOSIS — R350 Frequency of micturition: Secondary | ICD-10-CM | POA: Diagnosis not present

## 2022-06-16 DIAGNOSIS — F411 Generalized anxiety disorder: Secondary | ICD-10-CM | POA: Diagnosis not present

## 2022-06-30 DIAGNOSIS — F411 Generalized anxiety disorder: Secondary | ICD-10-CM | POA: Diagnosis not present

## 2022-07-01 DIAGNOSIS — R300 Dysuria: Secondary | ICD-10-CM | POA: Diagnosis not present

## 2022-07-01 DIAGNOSIS — R3915 Urgency of urination: Secondary | ICD-10-CM | POA: Diagnosis not present

## 2022-07-07 DIAGNOSIS — M19011 Primary osteoarthritis, right shoulder: Secondary | ICD-10-CM | POA: Diagnosis not present

## 2022-07-07 DIAGNOSIS — M7541 Impingement syndrome of right shoulder: Secondary | ICD-10-CM | POA: Diagnosis not present

## 2022-07-08 DIAGNOSIS — M9905 Segmental and somatic dysfunction of pelvic region: Secondary | ICD-10-CM | POA: Diagnosis not present

## 2022-07-08 DIAGNOSIS — M9904 Segmental and somatic dysfunction of sacral region: Secondary | ICD-10-CM | POA: Diagnosis not present

## 2022-07-08 DIAGNOSIS — M5441 Lumbago with sciatica, right side: Secondary | ICD-10-CM | POA: Diagnosis not present

## 2022-07-08 DIAGNOSIS — M9903 Segmental and somatic dysfunction of lumbar region: Secondary | ICD-10-CM | POA: Diagnosis not present

## 2022-07-08 DIAGNOSIS — M9902 Segmental and somatic dysfunction of thoracic region: Secondary | ICD-10-CM | POA: Diagnosis not present

## 2022-07-08 DIAGNOSIS — M5136 Other intervertebral disc degeneration, lumbar region: Secondary | ICD-10-CM | POA: Diagnosis not present

## 2022-07-10 DIAGNOSIS — M9903 Segmental and somatic dysfunction of lumbar region: Secondary | ICD-10-CM | POA: Diagnosis not present

## 2022-07-10 DIAGNOSIS — M9904 Segmental and somatic dysfunction of sacral region: Secondary | ICD-10-CM | POA: Diagnosis not present

## 2022-07-10 DIAGNOSIS — M9902 Segmental and somatic dysfunction of thoracic region: Secondary | ICD-10-CM | POA: Diagnosis not present

## 2022-07-10 DIAGNOSIS — M9905 Segmental and somatic dysfunction of pelvic region: Secondary | ICD-10-CM | POA: Diagnosis not present

## 2022-07-10 DIAGNOSIS — M5441 Lumbago with sciatica, right side: Secondary | ICD-10-CM | POA: Diagnosis not present

## 2022-07-10 DIAGNOSIS — M5136 Other intervertebral disc degeneration, lumbar region: Secondary | ICD-10-CM | POA: Diagnosis not present

## 2022-07-13 DIAGNOSIS — M5136 Other intervertebral disc degeneration, lumbar region: Secondary | ICD-10-CM | POA: Diagnosis not present

## 2022-07-13 DIAGNOSIS — M9904 Segmental and somatic dysfunction of sacral region: Secondary | ICD-10-CM | POA: Diagnosis not present

## 2022-07-13 DIAGNOSIS — M9903 Segmental and somatic dysfunction of lumbar region: Secondary | ICD-10-CM | POA: Diagnosis not present

## 2022-07-13 DIAGNOSIS — M9902 Segmental and somatic dysfunction of thoracic region: Secondary | ICD-10-CM | POA: Diagnosis not present

## 2022-07-13 DIAGNOSIS — M9905 Segmental and somatic dysfunction of pelvic region: Secondary | ICD-10-CM | POA: Diagnosis not present

## 2022-07-13 DIAGNOSIS — M5441 Lumbago with sciatica, right side: Secondary | ICD-10-CM | POA: Diagnosis not present

## 2022-07-14 DIAGNOSIS — F411 Generalized anxiety disorder: Secondary | ICD-10-CM | POA: Diagnosis not present

## 2022-07-17 DIAGNOSIS — M5441 Lumbago with sciatica, right side: Secondary | ICD-10-CM | POA: Diagnosis not present

## 2022-07-17 DIAGNOSIS — M9905 Segmental and somatic dysfunction of pelvic region: Secondary | ICD-10-CM | POA: Diagnosis not present

## 2022-07-17 DIAGNOSIS — M9902 Segmental and somatic dysfunction of thoracic region: Secondary | ICD-10-CM | POA: Diagnosis not present

## 2022-07-17 DIAGNOSIS — M9904 Segmental and somatic dysfunction of sacral region: Secondary | ICD-10-CM | POA: Diagnosis not present

## 2022-07-17 DIAGNOSIS — M9903 Segmental and somatic dysfunction of lumbar region: Secondary | ICD-10-CM | POA: Diagnosis not present

## 2022-07-17 DIAGNOSIS — M5136 Other intervertebral disc degeneration, lumbar region: Secondary | ICD-10-CM | POA: Diagnosis not present

## 2022-07-21 DIAGNOSIS — M5136 Other intervertebral disc degeneration, lumbar region: Secondary | ICD-10-CM | POA: Diagnosis not present

## 2022-07-21 DIAGNOSIS — M9904 Segmental and somatic dysfunction of sacral region: Secondary | ICD-10-CM | POA: Diagnosis not present

## 2022-07-21 DIAGNOSIS — M9905 Segmental and somatic dysfunction of pelvic region: Secondary | ICD-10-CM | POA: Diagnosis not present

## 2022-07-21 DIAGNOSIS — M9902 Segmental and somatic dysfunction of thoracic region: Secondary | ICD-10-CM | POA: Diagnosis not present

## 2022-07-21 DIAGNOSIS — M5441 Lumbago with sciatica, right side: Secondary | ICD-10-CM | POA: Diagnosis not present

## 2022-07-21 DIAGNOSIS — M9903 Segmental and somatic dysfunction of lumbar region: Secondary | ICD-10-CM | POA: Diagnosis not present

## 2022-07-22 DIAGNOSIS — M9902 Segmental and somatic dysfunction of thoracic region: Secondary | ICD-10-CM | POA: Diagnosis not present

## 2022-07-22 DIAGNOSIS — G4733 Obstructive sleep apnea (adult) (pediatric): Secondary | ICD-10-CM | POA: Diagnosis not present

## 2022-07-22 DIAGNOSIS — M9905 Segmental and somatic dysfunction of pelvic region: Secondary | ICD-10-CM | POA: Diagnosis not present

## 2022-07-22 DIAGNOSIS — M9904 Segmental and somatic dysfunction of sacral region: Secondary | ICD-10-CM | POA: Diagnosis not present

## 2022-07-22 DIAGNOSIS — M5136 Other intervertebral disc degeneration, lumbar region: Secondary | ICD-10-CM | POA: Diagnosis not present

## 2022-07-22 DIAGNOSIS — M9903 Segmental and somatic dysfunction of lumbar region: Secondary | ICD-10-CM | POA: Diagnosis not present

## 2022-07-22 DIAGNOSIS — M5441 Lumbago with sciatica, right side: Secondary | ICD-10-CM | POA: Diagnosis not present

## 2022-07-28 DIAGNOSIS — M9905 Segmental and somatic dysfunction of pelvic region: Secondary | ICD-10-CM | POA: Diagnosis not present

## 2022-07-28 DIAGNOSIS — M5136 Other intervertebral disc degeneration, lumbar region: Secondary | ICD-10-CM | POA: Diagnosis not present

## 2022-07-28 DIAGNOSIS — M5441 Lumbago with sciatica, right side: Secondary | ICD-10-CM | POA: Diagnosis not present

## 2022-07-28 DIAGNOSIS — F411 Generalized anxiety disorder: Secondary | ICD-10-CM | POA: Diagnosis not present

## 2022-07-28 DIAGNOSIS — M9902 Segmental and somatic dysfunction of thoracic region: Secondary | ICD-10-CM | POA: Diagnosis not present

## 2022-07-28 DIAGNOSIS — M9904 Segmental and somatic dysfunction of sacral region: Secondary | ICD-10-CM | POA: Diagnosis not present

## 2022-07-28 DIAGNOSIS — M9903 Segmental and somatic dysfunction of lumbar region: Secondary | ICD-10-CM | POA: Diagnosis not present

## 2022-07-29 DIAGNOSIS — M67813 Other specified disorders of tendon, right shoulder: Secondary | ICD-10-CM | POA: Diagnosis not present

## 2022-07-29 DIAGNOSIS — M7541 Impingement syndrome of right shoulder: Secondary | ICD-10-CM | POA: Diagnosis not present

## 2022-07-29 DIAGNOSIS — S43431A Superior glenoid labrum lesion of right shoulder, initial encounter: Secondary | ICD-10-CM | POA: Diagnosis not present

## 2022-07-29 DIAGNOSIS — G8918 Other acute postprocedural pain: Secondary | ICD-10-CM | POA: Diagnosis not present

## 2022-07-29 DIAGNOSIS — M19011 Primary osteoarthritis, right shoulder: Secondary | ICD-10-CM | POA: Diagnosis not present

## 2022-07-29 DIAGNOSIS — G4733 Obstructive sleep apnea (adult) (pediatric): Secondary | ICD-10-CM | POA: Diagnosis not present

## 2022-07-29 DIAGNOSIS — M7521 Bicipital tendinitis, right shoulder: Secondary | ICD-10-CM | POA: Diagnosis not present

## 2022-07-29 DIAGNOSIS — M25811 Other specified joint disorders, right shoulder: Secondary | ICD-10-CM | POA: Diagnosis not present

## 2022-07-29 DIAGNOSIS — F17210 Nicotine dependence, cigarettes, uncomplicated: Secondary | ICD-10-CM | POA: Diagnosis not present

## 2022-08-10 DIAGNOSIS — M7541 Impingement syndrome of right shoulder: Secondary | ICD-10-CM | POA: Diagnosis not present

## 2022-08-10 DIAGNOSIS — M19011 Primary osteoarthritis, right shoulder: Secondary | ICD-10-CM | POA: Diagnosis not present

## 2022-08-11 DIAGNOSIS — F411 Generalized anxiety disorder: Secondary | ICD-10-CM | POA: Diagnosis not present

## 2022-08-13 DIAGNOSIS — M19011 Primary osteoarthritis, right shoulder: Secondary | ICD-10-CM | POA: Diagnosis not present

## 2022-08-13 DIAGNOSIS — M7541 Impingement syndrome of right shoulder: Secondary | ICD-10-CM | POA: Diagnosis not present

## 2022-08-17 DIAGNOSIS — M19011 Primary osteoarthritis, right shoulder: Secondary | ICD-10-CM | POA: Diagnosis not present

## 2022-08-17 DIAGNOSIS — M7541 Impingement syndrome of right shoulder: Secondary | ICD-10-CM | POA: Diagnosis not present

## 2022-08-20 DIAGNOSIS — M19011 Primary osteoarthritis, right shoulder: Secondary | ICD-10-CM | POA: Diagnosis not present

## 2022-08-20 DIAGNOSIS — M7541 Impingement syndrome of right shoulder: Secondary | ICD-10-CM | POA: Diagnosis not present

## 2022-08-24 DIAGNOSIS — M7541 Impingement syndrome of right shoulder: Secondary | ICD-10-CM | POA: Diagnosis not present

## 2022-08-24 DIAGNOSIS — M19011 Primary osteoarthritis, right shoulder: Secondary | ICD-10-CM | POA: Diagnosis not present

## 2022-08-27 DIAGNOSIS — M7541 Impingement syndrome of right shoulder: Secondary | ICD-10-CM | POA: Diagnosis not present

## 2022-08-27 DIAGNOSIS — M19011 Primary osteoarthritis, right shoulder: Secondary | ICD-10-CM | POA: Diagnosis not present

## 2022-08-31 DIAGNOSIS — M7541 Impingement syndrome of right shoulder: Secondary | ICD-10-CM | POA: Diagnosis not present

## 2022-08-31 DIAGNOSIS — M19011 Primary osteoarthritis, right shoulder: Secondary | ICD-10-CM | POA: Diagnosis not present

## 2022-09-03 DIAGNOSIS — M19011 Primary osteoarthritis, right shoulder: Secondary | ICD-10-CM | POA: Diagnosis not present

## 2022-09-03 DIAGNOSIS — M7541 Impingement syndrome of right shoulder: Secondary | ICD-10-CM | POA: Diagnosis not present

## 2022-09-09 DIAGNOSIS — F411 Generalized anxiety disorder: Secondary | ICD-10-CM | POA: Diagnosis not present

## 2022-09-10 DIAGNOSIS — Z4789 Encounter for other orthopedic aftercare: Secondary | ICD-10-CM | POA: Diagnosis not present

## 2022-09-14 DIAGNOSIS — G43009 Migraine without aura, not intractable, without status migrainosus: Secondary | ICD-10-CM | POA: Diagnosis not present

## 2022-09-14 DIAGNOSIS — F411 Generalized anxiety disorder: Secondary | ICD-10-CM | POA: Diagnosis not present

## 2022-09-14 DIAGNOSIS — G4733 Obstructive sleep apnea (adult) (pediatric): Secondary | ICD-10-CM | POA: Diagnosis not present

## 2022-09-15 DIAGNOSIS — M5441 Lumbago with sciatica, right side: Secondary | ICD-10-CM | POA: Diagnosis not present

## 2022-09-15 DIAGNOSIS — M9904 Segmental and somatic dysfunction of sacral region: Secondary | ICD-10-CM | POA: Diagnosis not present

## 2022-09-15 DIAGNOSIS — M9903 Segmental and somatic dysfunction of lumbar region: Secondary | ICD-10-CM | POA: Diagnosis not present

## 2022-09-15 DIAGNOSIS — M9905 Segmental and somatic dysfunction of pelvic region: Secondary | ICD-10-CM | POA: Diagnosis not present

## 2022-09-15 DIAGNOSIS — M9902 Segmental and somatic dysfunction of thoracic region: Secondary | ICD-10-CM | POA: Diagnosis not present

## 2022-09-15 DIAGNOSIS — M5136 Other intervertebral disc degeneration, lumbar region: Secondary | ICD-10-CM | POA: Diagnosis not present

## 2022-09-16 DIAGNOSIS — L678 Other hair color and hair shaft abnormalities: Secondary | ICD-10-CM | POA: Diagnosis not present

## 2022-09-16 DIAGNOSIS — Z0000 Encounter for general adult medical examination without abnormal findings: Secondary | ICD-10-CM | POA: Diagnosis not present

## 2022-09-16 DIAGNOSIS — N926 Irregular menstruation, unspecified: Secondary | ICD-10-CM | POA: Diagnosis not present

## 2022-09-16 DIAGNOSIS — E282 Polycystic ovarian syndrome: Secondary | ICD-10-CM | POA: Diagnosis not present

## 2022-09-16 DIAGNOSIS — R635 Abnormal weight gain: Secondary | ICD-10-CM | POA: Diagnosis not present

## 2022-09-22 DIAGNOSIS — M5136 Other intervertebral disc degeneration, lumbar region: Secondary | ICD-10-CM | POA: Diagnosis not present

## 2022-09-22 DIAGNOSIS — M5441 Lumbago with sciatica, right side: Secondary | ICD-10-CM | POA: Diagnosis not present

## 2022-09-22 DIAGNOSIS — M9904 Segmental and somatic dysfunction of sacral region: Secondary | ICD-10-CM | POA: Diagnosis not present

## 2022-09-22 DIAGNOSIS — M9902 Segmental and somatic dysfunction of thoracic region: Secondary | ICD-10-CM | POA: Diagnosis not present

## 2022-09-22 DIAGNOSIS — M9905 Segmental and somatic dysfunction of pelvic region: Secondary | ICD-10-CM | POA: Diagnosis not present

## 2022-09-22 DIAGNOSIS — M9903 Segmental and somatic dysfunction of lumbar region: Secondary | ICD-10-CM | POA: Diagnosis not present

## 2022-10-01 DIAGNOSIS — M9902 Segmental and somatic dysfunction of thoracic region: Secondary | ICD-10-CM | POA: Diagnosis not present

## 2022-10-01 DIAGNOSIS — M9904 Segmental and somatic dysfunction of sacral region: Secondary | ICD-10-CM | POA: Diagnosis not present

## 2022-10-01 DIAGNOSIS — M5441 Lumbago with sciatica, right side: Secondary | ICD-10-CM | POA: Diagnosis not present

## 2022-10-01 DIAGNOSIS — M5136 Other intervertebral disc degeneration, lumbar region: Secondary | ICD-10-CM | POA: Diagnosis not present

## 2022-10-01 DIAGNOSIS — M9905 Segmental and somatic dysfunction of pelvic region: Secondary | ICD-10-CM | POA: Diagnosis not present

## 2022-10-01 DIAGNOSIS — M9903 Segmental and somatic dysfunction of lumbar region: Secondary | ICD-10-CM | POA: Diagnosis not present

## 2022-10-07 DIAGNOSIS — M6281 Muscle weakness (generalized): Secondary | ICD-10-CM | POA: Diagnosis not present

## 2022-10-07 DIAGNOSIS — M25511 Pain in right shoulder: Secondary | ICD-10-CM | POA: Diagnosis not present

## 2022-10-07 DIAGNOSIS — Z4789 Encounter for other orthopedic aftercare: Secondary | ICD-10-CM | POA: Diagnosis not present

## 2022-10-08 DIAGNOSIS — M9903 Segmental and somatic dysfunction of lumbar region: Secondary | ICD-10-CM | POA: Diagnosis not present

## 2022-10-08 DIAGNOSIS — M5441 Lumbago with sciatica, right side: Secondary | ICD-10-CM | POA: Diagnosis not present

## 2022-10-08 DIAGNOSIS — M9904 Segmental and somatic dysfunction of sacral region: Secondary | ICD-10-CM | POA: Diagnosis not present

## 2022-10-08 DIAGNOSIS — M9905 Segmental and somatic dysfunction of pelvic region: Secondary | ICD-10-CM | POA: Diagnosis not present

## 2022-10-08 DIAGNOSIS — M9902 Segmental and somatic dysfunction of thoracic region: Secondary | ICD-10-CM | POA: Diagnosis not present

## 2022-10-13 DIAGNOSIS — F411 Generalized anxiety disorder: Secondary | ICD-10-CM | POA: Diagnosis not present

## 2022-10-15 DIAGNOSIS — M25511 Pain in right shoulder: Secondary | ICD-10-CM | POA: Diagnosis not present

## 2022-10-15 DIAGNOSIS — Z4789 Encounter for other orthopedic aftercare: Secondary | ICD-10-CM | POA: Diagnosis not present

## 2022-10-15 DIAGNOSIS — M6281 Muscle weakness (generalized): Secondary | ICD-10-CM | POA: Diagnosis not present

## 2022-10-16 DIAGNOSIS — M9904 Segmental and somatic dysfunction of sacral region: Secondary | ICD-10-CM | POA: Diagnosis not present

## 2022-10-16 DIAGNOSIS — M9903 Segmental and somatic dysfunction of lumbar region: Secondary | ICD-10-CM | POA: Diagnosis not present

## 2022-10-16 DIAGNOSIS — M5441 Lumbago with sciatica, right side: Secondary | ICD-10-CM | POA: Diagnosis not present

## 2022-10-16 DIAGNOSIS — M9902 Segmental and somatic dysfunction of thoracic region: Secondary | ICD-10-CM | POA: Diagnosis not present

## 2022-10-16 DIAGNOSIS — M9905 Segmental and somatic dysfunction of pelvic region: Secondary | ICD-10-CM | POA: Diagnosis not present

## 2022-10-23 DIAGNOSIS — M5441 Lumbago with sciatica, right side: Secondary | ICD-10-CM | POA: Diagnosis not present

## 2022-10-23 DIAGNOSIS — M9904 Segmental and somatic dysfunction of sacral region: Secondary | ICD-10-CM | POA: Diagnosis not present

## 2022-10-23 DIAGNOSIS — M9905 Segmental and somatic dysfunction of pelvic region: Secondary | ICD-10-CM | POA: Diagnosis not present

## 2022-10-23 DIAGNOSIS — M9903 Segmental and somatic dysfunction of lumbar region: Secondary | ICD-10-CM | POA: Diagnosis not present

## 2022-10-23 DIAGNOSIS — M9902 Segmental and somatic dysfunction of thoracic region: Secondary | ICD-10-CM | POA: Diagnosis not present

## 2022-10-28 DIAGNOSIS — M25552 Pain in left hip: Secondary | ICD-10-CM | POA: Diagnosis not present

## 2022-10-28 DIAGNOSIS — M25551 Pain in right hip: Secondary | ICD-10-CM | POA: Diagnosis not present

## 2022-10-28 DIAGNOSIS — M25511 Pain in right shoulder: Secondary | ICD-10-CM | POA: Diagnosis not present

## 2022-10-28 DIAGNOSIS — M25512 Pain in left shoulder: Secondary | ICD-10-CM | POA: Diagnosis not present

## 2022-10-28 DIAGNOSIS — R293 Abnormal posture: Secondary | ICD-10-CM | POA: Diagnosis not present

## 2022-11-03 DIAGNOSIS — M25551 Pain in right hip: Secondary | ICD-10-CM | POA: Diagnosis not present

## 2022-11-03 DIAGNOSIS — M25512 Pain in left shoulder: Secondary | ICD-10-CM | POA: Diagnosis not present

## 2022-11-03 DIAGNOSIS — M25552 Pain in left hip: Secondary | ICD-10-CM | POA: Diagnosis not present

## 2022-11-03 DIAGNOSIS — M25511 Pain in right shoulder: Secondary | ICD-10-CM | POA: Diagnosis not present

## 2022-11-03 DIAGNOSIS — R293 Abnormal posture: Secondary | ICD-10-CM | POA: Diagnosis not present

## 2022-11-10 DIAGNOSIS — M25511 Pain in right shoulder: Secondary | ICD-10-CM | POA: Diagnosis not present

## 2022-11-10 DIAGNOSIS — M25551 Pain in right hip: Secondary | ICD-10-CM | POA: Diagnosis not present

## 2022-11-10 DIAGNOSIS — M25512 Pain in left shoulder: Secondary | ICD-10-CM | POA: Diagnosis not present

## 2022-11-10 DIAGNOSIS — R293 Abnormal posture: Secondary | ICD-10-CM | POA: Diagnosis not present

## 2022-11-10 DIAGNOSIS — M25552 Pain in left hip: Secondary | ICD-10-CM | POA: Diagnosis not present

## 2022-11-16 DIAGNOSIS — R6889 Other general symptoms and signs: Secondary | ICD-10-CM | POA: Diagnosis not present

## 2022-11-16 DIAGNOSIS — M25511 Pain in right shoulder: Secondary | ICD-10-CM | POA: Diagnosis not present

## 2022-11-16 DIAGNOSIS — R293 Abnormal posture: Secondary | ICD-10-CM | POA: Diagnosis not present

## 2022-11-23 DIAGNOSIS — J209 Acute bronchitis, unspecified: Secondary | ICD-10-CM | POA: Diagnosis not present

## 2022-12-01 DIAGNOSIS — M7541 Impingement syndrome of right shoulder: Secondary | ICD-10-CM | POA: Diagnosis not present

## 2022-12-01 DIAGNOSIS — Z9889 Other specified postprocedural states: Secondary | ICD-10-CM | POA: Diagnosis not present

## 2022-12-01 DIAGNOSIS — E282 Polycystic ovarian syndrome: Secondary | ICD-10-CM | POA: Diagnosis not present

## 2022-12-01 DIAGNOSIS — M19011 Primary osteoarthritis, right shoulder: Secondary | ICD-10-CM | POA: Diagnosis not present

## 2022-12-02 DIAGNOSIS — E282 Polycystic ovarian syndrome: Secondary | ICD-10-CM | POA: Diagnosis not present

## 2022-12-15 DIAGNOSIS — M19011 Primary osteoarthritis, right shoulder: Secondary | ICD-10-CM | POA: Diagnosis not present

## 2022-12-15 DIAGNOSIS — Z9889 Other specified postprocedural states: Secondary | ICD-10-CM | POA: Diagnosis not present

## 2022-12-15 DIAGNOSIS — M7541 Impingement syndrome of right shoulder: Secondary | ICD-10-CM | POA: Diagnosis not present

## 2023-01-18 DIAGNOSIS — G4733 Obstructive sleep apnea (adult) (pediatric): Secondary | ICD-10-CM | POA: Diagnosis not present

## 2023-01-28 DIAGNOSIS — R3915 Urgency of urination: Secondary | ICD-10-CM | POA: Diagnosis not present

## 2023-01-28 DIAGNOSIS — R350 Frequency of micturition: Secondary | ICD-10-CM | POA: Diagnosis not present

## 2023-01-28 DIAGNOSIS — R300 Dysuria: Secondary | ICD-10-CM | POA: Diagnosis not present

## 2023-03-18 DIAGNOSIS — E785 Hyperlipidemia, unspecified: Secondary | ICD-10-CM | POA: Diagnosis not present

## 2023-03-18 DIAGNOSIS — L301 Dyshidrosis [pompholyx]: Secondary | ICD-10-CM | POA: Diagnosis not present

## 2023-03-18 DIAGNOSIS — Z6841 Body Mass Index (BMI) 40.0 and over, adult: Secondary | ICD-10-CM | POA: Diagnosis not present

## 2023-03-18 DIAGNOSIS — R7303 Prediabetes: Secondary | ICD-10-CM | POA: Diagnosis not present

## 2023-03-18 DIAGNOSIS — Z0000 Encounter for general adult medical examination without abnormal findings: Secondary | ICD-10-CM | POA: Diagnosis not present

## 2023-04-22 DIAGNOSIS — E282 Polycystic ovarian syndrome: Secondary | ICD-10-CM | POA: Diagnosis not present

## 2023-06-03 DIAGNOSIS — G4733 Obstructive sleep apnea (adult) (pediatric): Secondary | ICD-10-CM | POA: Diagnosis not present

## 2023-06-23 DIAGNOSIS — R300 Dysuria: Secondary | ICD-10-CM | POA: Diagnosis not present

## 2023-06-23 DIAGNOSIS — R8290 Unspecified abnormal findings in urine: Secondary | ICD-10-CM | POA: Diagnosis not present

## 2023-07-18 DIAGNOSIS — G4733 Obstructive sleep apnea (adult) (pediatric): Secondary | ICD-10-CM | POA: Diagnosis not present

## 2023-07-22 DIAGNOSIS — G4733 Obstructive sleep apnea (adult) (pediatric): Secondary | ICD-10-CM | POA: Diagnosis not present

## 2023-08-31 DIAGNOSIS — M7989 Other specified soft tissue disorders: Secondary | ICD-10-CM | POA: Diagnosis not present

## 2023-08-31 DIAGNOSIS — S93402A Sprain of unspecified ligament of left ankle, initial encounter: Secondary | ICD-10-CM | POA: Diagnosis not present

## 2023-08-31 DIAGNOSIS — M25572 Pain in left ankle and joints of left foot: Secondary | ICD-10-CM | POA: Diagnosis not present

## 2023-08-31 DIAGNOSIS — S93492A Sprain of other ligament of left ankle, initial encounter: Secondary | ICD-10-CM | POA: Diagnosis not present

## 2023-09-23 DIAGNOSIS — S93492A Sprain of other ligament of left ankle, initial encounter: Secondary | ICD-10-CM | POA: Diagnosis not present

## 2023-10-27 DIAGNOSIS — E282 Polycystic ovarian syndrome: Secondary | ICD-10-CM | POA: Diagnosis not present

## 2023-10-27 DIAGNOSIS — Z0000 Encounter for general adult medical examination without abnormal findings: Secondary | ICD-10-CM | POA: Diagnosis not present

## 2023-10-27 DIAGNOSIS — Z1151 Encounter for screening for human papillomavirus (HPV): Secondary | ICD-10-CM | POA: Diagnosis not present

## 2023-10-27 DIAGNOSIS — Z124 Encounter for screening for malignant neoplasm of cervix: Secondary | ICD-10-CM | POA: Diagnosis not present

## 2023-10-27 DIAGNOSIS — R7303 Prediabetes: Secondary | ICD-10-CM | POA: Diagnosis not present

## 2023-10-28 DIAGNOSIS — M722 Plantar fascial fibromatosis: Secondary | ICD-10-CM | POA: Diagnosis not present

## 2023-10-28 DIAGNOSIS — M7662 Achilles tendinitis, left leg: Secondary | ICD-10-CM | POA: Diagnosis not present

## 2023-10-28 DIAGNOSIS — M62462 Contracture of muscle, left lower leg: Secondary | ICD-10-CM | POA: Diagnosis not present

## 2023-10-28 DIAGNOSIS — M62461 Contracture of muscle, right lower leg: Secondary | ICD-10-CM | POA: Diagnosis not present

## 2023-10-28 DIAGNOSIS — M7661 Achilles tendinitis, right leg: Secondary | ICD-10-CM | POA: Diagnosis not present

## 2023-11-02 DIAGNOSIS — E282 Polycystic ovarian syndrome: Secondary | ICD-10-CM | POA: Diagnosis not present

## 2023-11-02 DIAGNOSIS — R7303 Prediabetes: Secondary | ICD-10-CM | POA: Diagnosis not present

## 2023-11-11 DIAGNOSIS — M722 Plantar fascial fibromatosis: Secondary | ICD-10-CM | POA: Diagnosis not present

## 2023-11-11 DIAGNOSIS — M62462 Contracture of muscle, left lower leg: Secondary | ICD-10-CM | POA: Diagnosis not present

## 2023-11-11 DIAGNOSIS — M79671 Pain in right foot: Secondary | ICD-10-CM | POA: Diagnosis not present

## 2023-11-11 DIAGNOSIS — M79672 Pain in left foot: Secondary | ICD-10-CM | POA: Diagnosis not present

## 2023-11-11 DIAGNOSIS — M62461 Contracture of muscle, right lower leg: Secondary | ICD-10-CM | POA: Diagnosis not present

## 2023-11-17 DIAGNOSIS — M79672 Pain in left foot: Secondary | ICD-10-CM | POA: Diagnosis not present

## 2023-12-06 DIAGNOSIS — M722 Plantar fascial fibromatosis: Secondary | ICD-10-CM | POA: Diagnosis not present
